# Patient Record
Sex: Male | Born: 1970 | Race: White | Hispanic: No | Marital: Married | Smoking: Former smoker
Health system: Southern US, Community
[De-identification: ages and names within clinical notes are randomized; demographics above are authoritative.]

## PROBLEM LIST (undated history)

## (undated) DIAGNOSIS — K219 Gastro-esophageal reflux disease without esophagitis: Secondary | ICD-10-CM

## (undated) DIAGNOSIS — F3181 Bipolar II disorder: Secondary | ICD-10-CM

## (undated) DIAGNOSIS — B019 Varicella without complication: Secondary | ICD-10-CM

## (undated) DIAGNOSIS — K5190 Ulcerative colitis, unspecified, without complications: Secondary | ICD-10-CM

## (undated) DIAGNOSIS — F419 Anxiety disorder, unspecified: Secondary | ICD-10-CM

## (undated) DIAGNOSIS — R0602 Shortness of breath: Secondary | ICD-10-CM

## (undated) DIAGNOSIS — J309 Allergic rhinitis, unspecified: Secondary | ICD-10-CM

## (undated) DIAGNOSIS — T7840XA Allergy, unspecified, initial encounter: Secondary | ICD-10-CM

## (undated) HISTORY — DX: Ulcerative colitis, unspecified, without complications: K51.90

## (undated) HISTORY — DX: Gastro-esophageal reflux disease without esophagitis: K21.9

## (undated) HISTORY — PX: COLONOSCOPY: SHX174

## (undated) HISTORY — DX: Bipolar II disorder: F31.81

## (undated) HISTORY — DX: Anxiety disorder, unspecified: F41.9

## (undated) HISTORY — PX: WISDOM TOOTH EXTRACTION: SHX21

## (undated) HISTORY — PX: TONSILECTOMY/ADENOIDECTOMY WITH MYRINGOTOMY: SHX6125

## (undated) HISTORY — DX: Allergic rhinitis, unspecified: J30.9

## (undated) HISTORY — DX: Allergy, unspecified, initial encounter: T78.40XA

## (undated) HISTORY — DX: Shortness of breath: R06.02

## (undated) HISTORY — DX: Varicella without complication: B01.9

---

## 2003-06-03 ENCOUNTER — Ambulatory Visit (HOSPITAL_COMMUNITY): Admission: RE | Admit: 2003-06-03 | Discharge: 2003-06-03 | Payer: Self-pay | Admitting: Gastroenterology

## 2013-10-29 ENCOUNTER — Encounter: Payer: Self-pay | Admitting: Gastroenterology

## 2013-11-19 ENCOUNTER — Ambulatory Visit: Payer: Self-pay | Admitting: Gastroenterology

## 2015-05-13 ENCOUNTER — Ambulatory Visit (INDEPENDENT_AMBULATORY_CARE_PROVIDER_SITE_OTHER): Payer: BLUE CROSS/BLUE SHIELD | Admitting: Gastroenterology

## 2015-05-13 ENCOUNTER — Other Ambulatory Visit (INDEPENDENT_AMBULATORY_CARE_PROVIDER_SITE_OTHER): Payer: BLUE CROSS/BLUE SHIELD

## 2015-05-13 ENCOUNTER — Encounter: Payer: Self-pay | Admitting: Gastroenterology

## 2015-05-13 VITALS — BP 104/76 | HR 68 | Ht 67.72 in | Wt 202.0 lb

## 2015-05-13 DIAGNOSIS — K5100 Ulcerative (chronic) pancolitis without complications: Secondary | ICD-10-CM

## 2015-05-13 LAB — COMPREHENSIVE METABOLIC PANEL
ALBUMIN: 3.9 g/dL (ref 3.5–5.2)
ALK PHOS: 86 U/L (ref 39–117)
ALT: 18 U/L (ref 0–53)
AST: 18 U/L (ref 0–37)
BILIRUBIN TOTAL: 0.3 mg/dL (ref 0.2–1.2)
BUN: 8 mg/dL (ref 6–23)
CALCIUM: 9.5 mg/dL (ref 8.4–10.5)
CO2: 29 mEq/L (ref 19–32)
Chloride: 103 mEq/L (ref 96–112)
Creatinine, Ser: 0.91 mg/dL (ref 0.40–1.50)
GFR: 95.58 mL/min (ref >60.00)
Glucose, Bld: 92 mg/dL (ref 70–99)
Potassium: 4.3 mEq/L (ref 3.5–5.1)
Sodium: 140 mEq/L (ref 135–145)
TOTAL PROTEIN: 7.5 g/dL (ref 6.0–8.3)

## 2015-05-13 LAB — CBC WITH DIFFERENTIAL/PLATELET
BASOS ABS: 0.1 10*3/uL (ref 0.0–0.1)
Basophils Relative: 0.8 % (ref 0.0–3.0)
Eosinophils Absolute: 0.6 10*3/uL (ref 0.0–0.7)
Eosinophils Relative: 6.1 % — ABNORMAL HIGH (ref 0.0–5.0)
HCT: 41 % (ref 39.0–52.0)
Hemoglobin: 13.6 g/dL (ref 13.0–17.0)
LYMPHS ABS: 2 10*3/uL (ref 0.7–4.0)
Lymphocytes Relative: 20.4 % (ref 12.0–46.0)
MCHC: 33.2 g/dL (ref 30.0–36.0)
MCV: 89.3 fl (ref 78.0–100.0)
MONO ABS: 0.7 10*3/uL (ref 0.1–1.0)
MONOS PCT: 6.7 % (ref 3.0–12.0)
NEUTROS ABS: 6.4 10*3/uL (ref 1.4–7.7)
Neutrophils Relative %: 66 % (ref 43.0–77.0)
PLATELETS: 442 10*3/uL — AB (ref 150.0–400.0)
RBC: 4.6 Mil/uL (ref 4.22–5.81)
RDW: 13.5 % (ref 11.5–15.5)
WBC: 9.8 10*3/uL (ref 4.0–10.5)

## 2015-05-13 LAB — SEDIMENTATION RATE: SED RATE: 55 mm/h — AB (ref 0–22)

## 2015-05-13 MED ORDER — NA SULFATE-K SULFATE-MG SULF 17.5-3.13-1.6 GM/177ML PO SOLN: 1.0000 | Freq: Once | ORAL | Status: DC

## 2015-05-13 NOTE — Patient Instructions (Addendum)
We will get records sent from your previous gastroenterologist (Dr. Watt Climes at [facility]) for review.  This will include any endoscopic (colonoscopy or upper endoscopy) procedures and any associated pathology reports.   You will have labs checked today in the basement lab.  Please head down after you check out with the front desk  (cbc, cmet, esr). You will be set up for a colonoscopy. Crohn's and [place].

## 2015-05-13 NOTE — Progress Notes (Signed)
HPI: This is a very pleasant 44 year old man  who was self-referred  Chief complaint is ulcerative colitis  He was having diarrhea, sometimes bloody, urgency.  Saw Dr. Watt Climes, underwent testing (colonoscopies times 2).  He was told he had ulcerative colitis, sounds like just distal colitis per patient. He took sulfasalazine orally.  Diet was restricted.  May have briefly been on steroids.  He used suppositories 1-2 times per week. He says the treatment helped only somewhat.   Frequency really seemed to improve.  He was not happy about his dietary restrictions.    No colitis meds for at least 6-8 years.  Lately he says he has a flare, diarrhea; bleeding, urgency, cramping. This flare had been for 6-8 weeks.    He had a flare about a year ago that was worse, lasted for 8 weeks.  No specific treatments.  No rashes on skin, no eye issues or joint problems.   Overall stable.  No NSAIDs.  Review of systems: Pertinent positive and negative review of systems were noted in the above HPI section. Complete review of systems was performed and was otherwise normal.   Past Medical History  Diagnosis Date  . Ulcerative colitis [facility])     Past Surgical History  Procedure Laterality Date  . Resection tonsil radical    . Colonoscopy      x2    No current outpatient prescriptions on file.   No current facility-administered medications for this visit.    Allergies as of 05/13/2015  . (No Known Allergies)    History reviewed. No pertinent family history.  Social History   Social History  . Marital Status: Single    Spouse Name: N/A  . Number of Children: 0  . Years of Education: N/A   Occupational History  . umemployed     Social History Main Topics  . Smoking status: Former Research scientist (life sciences)  . Smokeless tobacco: Never Used  . Alcohol Use: 0.0 oz/week    0 Standard drinks or equivalent per week     Comment:  2 drinks a day  . Drug Use: Not on file  . Sexual Activity: Not on file    Other Topics Concern  . Not on file   Social History Narrative  . No narrative on file     Physical Exam: BP 104/76 mmHg  Pulse 68  Ht 5' 7.72" (1.72 m)  Wt 202 lb (91.627 kg)  BMI 30.97 kg/m2 Constitutional: generally well-appearing Psychiatric: alert and oriented x3 Eyes: extraocular movements intact Mouth: oral pharynx moist, no lesions Neck: supple no lymphadenopathy Cardiovascular: heart regular rate and rhythm Lungs: clear to auscultation bilaterally Abdomen: soft, nontender, nondistended, no obvious ascites, no peritoneal signs, normal bowel sounds Extremities: no lower extremity edema bilaterally Skin: no lesions on visible extremities   Assessment and plan: 44 y.o. male with  history of ulcerative colitis now with loose stools, urgency, intermittent bleeding and cramping  I would like to get records from his previous gastroenterologist for review. He has been off of any specific colitis medicines for 68 years. His last colonoscopy was about 8 years ago I think it is very important that we restage his disease with a full colonoscopy. He will also get CBC, C met, sedimentation rate at his soonest convenience as well.   Owens Loffler, MD [facility] 05/13/2015, 2:23 PM  Cc:

## 2015-05-20 ENCOUNTER — Encounter (HOSPITAL_COMMUNITY): Payer: Self-pay | Admitting: *Deleted

## 2015-05-29 ENCOUNTER — Ambulatory Visit (HOSPITAL_COMMUNITY)
Admission: RE | Admit: 2015-05-29 | Discharge: 2015-05-29 | Disposition: A | Discharge status: Home / Self Care | Payer: BLUE CROSS/BLUE SHIELD | Source: Ambulatory Visit | Attending: Gastroenterology | Admitting: Gastroenterology

## 2015-05-29 ENCOUNTER — Ambulatory Visit (HOSPITAL_COMMUNITY): Payer: BLUE CROSS/BLUE SHIELD | Admitting: Anesthesiology

## 2015-05-29 ENCOUNTER — Encounter (HOSPITAL_COMMUNITY): Payer: Self-pay | Admitting: Anesthesiology

## 2015-05-29 ENCOUNTER — Telehealth: Payer: Self-pay

## 2015-05-29 ENCOUNTER — Telehealth: Payer: Self-pay | Admitting: Gastroenterology

## 2015-05-29 ENCOUNTER — Encounter (HOSPITAL_COMMUNITY)
Admission: RE | Disposition: A | Discharge status: Home / Self Care | Payer: Self-pay | Source: Ambulatory Visit | Attending: Gastroenterology

## 2015-05-29 DIAGNOSIS — K5100 Ulcerative (chronic) pancolitis without complications: Secondary | ICD-10-CM | POA: Diagnosis not present

## 2015-05-29 DIAGNOSIS — K5150 Left sided colitis without complications: Secondary | ICD-10-CM | POA: Diagnosis not present

## 2015-05-29 DIAGNOSIS — K5190 Ulcerative colitis, unspecified, without complications: Secondary | ICD-10-CM | POA: Diagnosis present

## 2015-05-29 DIAGNOSIS — Z87891 Personal history of nicotine dependence: Secondary | ICD-10-CM | POA: Diagnosis not present

## 2015-05-29 HISTORY — PX: COLONOSCOPY WITH PROPOFOL: SHX5780

## 2015-05-29 SURGERY — COLONOSCOPY WITH PROPOFOL
Anesthesia: Monitor Anesthesia Care

## 2015-05-29 MED ORDER — PROPOFOL 10 MG/ML IV BOLUS
INTRAVENOUS | Status: DC | PRN
  Administered 2015-05-29 (×2): 50 mg via INTRAVENOUS

## 2015-05-29 MED ORDER — LACTATED RINGERS IV SOLN
INTRAVENOUS | Status: DC | PRN
  Administered 2015-05-29: 11:00:00 via INTRAVENOUS

## 2015-05-29 MED ORDER — PROPOFOL 10 MG/ML IV BOLUS
INTRAVENOUS | Status: AC
  Filled 2015-05-29: qty 40

## 2015-05-29 MED ORDER — PROPOFOL 500 MG/50ML IV EMUL
INTRAVENOUS | Status: DC | PRN
  Administered 2015-05-29: 140 ug/kg/min via INTRAVENOUS

## 2015-05-29 MED ORDER — MESALAMINE 1.2 G PO TBEC: 4.8000 g | DELAYED_RELEASE_TABLET | Freq: Every day | ORAL | Status: DC

## 2015-05-29 MED ORDER — PREDNISONE 10 MG PO TABS: 40.0000 mg | ORAL_TABLET | Freq: Every day | ORAL | Status: DC

## 2015-05-29 MED ORDER — LIDOCAINE HCL (CARDIAC) 20 MG/ML IV SOLN
INTRAVENOUS | Status: DC | PRN
  Administered 2015-05-29: 50 mg via INTRAVENOUS

## 2015-05-29 MED ORDER — SODIUM CHLORIDE 0.9 % IV SOLN: INTRAVENOUS | Status: DC

## 2015-05-29 SURGICAL SUPPLY — 21 items

## 2015-05-29 NOTE — Telephone Encounter (Signed)
-----   Message from [name], MD sent at 05/29/2015 11:45 AM EST ----- He needs rov with me or extender in 4-6 weeks, UC follow up.  Thanks

## 2015-05-29 NOTE — H&P (View-Only) (Signed)
HPI: This is a very pleasant 44 year old man  who was self-referred  Chief complaint is ulcerative colitis  He was having diarrhea, sometimes bloody, urgency.  Saw Dr. Watt Climes, underwent testing (colonoscopies times 2).  He was told he had ulcerative colitis, sounds like just distal colitis per patient. He took sulfasalazine orally.  Diet was restricted.  May have briefly been on steroids.  He used suppositories 1-2 times per week. He says the treatment helped only somewhat.   Frequency really seemed to improve.  He was not happy about his dietary restrictions.    No colitis meds for at least 6-8 years.  Lately he says he has a flare, diarrhea; bleeding, urgency, cramping. This flare had been for 6-8 weeks.    He had a flare about a year ago that was worse, lasted for 8 weeks.  No specific treatments.  No rashes on skin, no eye issues or joint problems.   Overall stable.  No NSAIDs.  Review of systems: Pertinent positive and negative review of systems were noted in the above HPI section. Complete review of systems was performed and was otherwise normal.   Past Medical History  Diagnosis Date  . Ulcerative colitis [facility])     Past Surgical History  Procedure Laterality Date  . Resection tonsil radical    . Colonoscopy      x2    No current outpatient prescriptions on file.   No current facility-administered medications for this visit.    Allergies as of 05/13/2015  . (No Known Allergies)    History reviewed. No pertinent family history.  Social History   Social History  . Marital Status: Single    Spouse Name: N/A  . Number of Children: 0  . Years of Education: N/A   Occupational History  . umemployed     Social History Main Topics  . Smoking status: Former Research scientist (life sciences)  . Smokeless tobacco: Never Used  . Alcohol Use: 0.0 oz/week    0 Standard drinks or equivalent per week     Comment:  2 drinks a day  . Drug Use: Not on file  . Sexual Activity: Not on file    Other Topics Concern  . Not on file   Social History Narrative  . No narrative on file     Physical Exam: BP 104/76 mmHg  Pulse 68  Ht 5' 7.72" (1.72 m)  Wt 202 lb (91.627 kg)  BMI 30.97 kg/m2 Constitutional: generally well-appearing Psychiatric: alert and oriented x3 Eyes: extraocular movements intact Mouth: oral pharynx moist, no lesions Neck: supple no lymphadenopathy Cardiovascular: heart regular rate and rhythm Lungs: clear to auscultation bilaterally Abdomen: soft, nontender, nondistended, no obvious ascites, no peritoneal signs, normal bowel sounds Extremities: no lower extremity edema bilaterally Skin: no lesions on visible extremities   Assessment and plan: 44 y.o. male with  history of ulcerative colitis now with loose stools, urgency, intermittent bleeding and cramping  I would like to get records from his previous gastroenterologist for review. He has been off of any specific colitis medicines for 68 years. His last colonoscopy was about 8 years ago I think it is very important that we restage his disease with a full colonoscopy. He will also get CBC, C met, sedimentation rate at his soonest convenience as well.   Owens Loffler, MD [facility] 05/13/2015, 2:23 PM  Cc:

## 2015-05-29 NOTE — Telephone Encounter (Signed)
Status: Signed       Expand All Collapse All   ----- Message from [name], MD sent at 05/29/2015 11:45 AM EST ----- He needs rov with me or extender in 4-6 weeks, UC follow up. Thanks

## 2015-05-29 NOTE — Anesthesia Preprocedure Evaluation (Signed)
Anesthesia Evaluation  Patient identified by MRN, date of birth, ID band Patient awake    Reviewed: Allergy & Precautions, H&P , NPO status , Patient's Chart, lab work & pertinent test results  Airway Mallampati: II  TM Distance: >3 FB Neck ROM: full    Dental no notable dental hx. (+) Dental Advisory Given, Teeth Intact   Pulmonary neg pulmonary ROS, former smoker,    Pulmonary exam normal breath sounds clear to auscultation       Cardiovascular Exercise Tolerance: Good negative cardio ROS Normal cardiovascular exam Rhythm:regular Rate:Normal     Neuro/Psych negative neurological ROS  negative psych ROS   GI/Hepatic negative GI ROS, Neg liver ROS,   Endo/Other  negative endocrine ROS  Renal/GU negative Renal ROS  negative genitourinary   Musculoskeletal   Abdominal   Peds  Hematology negative hematology ROS (+)   Anesthesia Other Findings   Reproductive/Obstetrics negative OB ROS                             Anesthesia Physical Anesthesia Plan  ASA: II  Anesthesia Plan: MAC   Post-op Pain Management:    Induction:   Airway Management Planned:   Additional Equipment:   Intra-op Plan:   Post-operative Plan:   Informed Consent: I have reviewed the patients History and Physical, chart, labs and discussed the procedure including the risks, benefits and alternatives for the proposed anesthesia with the patient or authorized representative who has indicated his/her understanding and acceptance.   Dental Advisory Given  Plan Discussed with: CRNA and Surgeon  Anesthesia Plan Comments:         Anesthesia Quick Evaluation

## 2015-05-29 NOTE — Transfer of Care (Signed)
Immediate Anesthesia Transfer of Care Note  Patient: Brian Bates  Procedure(s) Performed: Procedure(s): COLONOSCOPY WITH PROPOFOL (N/A)  Patient Location: PACU  Anesthesia Type:MAC  Level of Consciousness: awake, alert  and oriented  Airway & Oxygen Therapy: Patient Spontanous Breathing and Patient connected to face mask oxygen  Post-op Assessment: Report given to RN and Post -op Vital signs reviewed and stable  Post vital signs: Reviewed and stable  Last Vitals:  Filed Vitals:   05/29/15 1107  BP: 134/68  Pulse: 86  Temp: 36.8 C  Resp: 16    Complications: No apparent anesthesia complications

## 2015-05-29 NOTE — Anesthesia Postprocedure Evaluation (Signed)
Anesthesia Post Note  Patient: Brian Bates  Procedure(s) Performed: Procedure(s) (LRB): COLONOSCOPY WITH PROPOFOL (N/A)  Patient location during evaluation: PACU Anesthesia Type: MAC Level of consciousness: awake and alert Pain management: pain level controlled Vital Signs Assessment: post-procedure vital signs reviewed and stable Respiratory status: spontaneous breathing, nonlabored ventilation, respiratory function stable and patient connected to nasal cannula oxygen Cardiovascular status: blood pressure returned to baseline and stable Postop Assessment: no signs of nausea or vomiting Anesthetic complications: no    Last Vitals:  Filed Vitals:   05/29/15 1107 05/29/15 1139  BP: 134/68 101/57  Pulse: 86 73  Temp: 36.8 C 37 C  Resp: 16 16    Last Pain: There were no vitals filed for this visit.               [name] L

## 2015-05-29 NOTE — Telephone Encounter (Signed)
See alternate note  

## 2015-05-29 NOTE — Op Note (Signed)
[facility] [place], [address]   COLONOSCOPY PROCEDURE REPORT  PATIENT: Brian Bates, Brian Bates  MR#: [id number] BIRTHDATE: July 17, 1970 , 40  yrs. old GENDER: male ENDOSCOPIST: [name], MD PROCEDURE DATE:  05/29/2015 PROCEDURE:   Colonoscopy, diagnostic and Colonoscopy with biopsy First Screening Colonoscopy - Avg.  risk and is 50 yrs.  old or older - No.  Prior Negative Screening - Now for repeat screening. N/A  History of Adenoma - Now for follow-up colonoscopy & has been > or = to 3 yrs.  N/A  Recommend repeat exam, <10 yrs? No ASA CLASS:   Class II INDICATIONS:history of UC, previously with Dr.  Watt Climes, not on UC meds for 6-8 years; now with diarrhea, cramping. MEDICATIONS: Monitored anesthesia care  DESCRIPTION OF PROCEDURE:   After the risks benefits and alternatives of the procedure were thoroughly explained, informed consent was obtained.  The digital rectal exam revealed no abnormalities of the rectum.   The [id number]  endoscope was introduced through the anus and advanced to the terminal ileum which was intubated for a short distance. No adverse events experienced.   The quality of the prep was excellent.  The instrument was then slowly withdrawn as the colon was fully examined. Estimated blood loss is zero unless otherwise noted in this procedure report.   COLON FINDINGS: The examined terminal ileum appeared to be normal. The colon was severely inflamed from anus to about 65cm into the colon with a fairly distinct transition to normal appearing mucosa. The right colon mucosa was normal, was biopsied extensively.  The overt colitis in left colon was also biopsies and sent to pathology.  Retroflexion was not performed due to a narrow rectal vault. The time to cecum = 3.8 Withdrawal time = 12.8   The scope was withdrawn and the procedure completed. COMPLICATIONS: There were no immediate complications.  ENDOSCOPIC  IMPRESSION: 1.   The examined terminal ileum appeared to be normal 2.   The colon was severely inflamed from anus to about 65cm into the colon with a fairly distinct transition to normal appearing mucosa.  The right colon mucosa was normal, was biopsied extensively.  The overt colitis in left colon was also biopsies and sent to pathology  RECOMMENDATIONS: Will start on prednisone 40mg  once daily.  Do not taper this until you are seen in the office (will set up appt for 4-5 weeks from now).  Please also start lialda (4 pills once daily).  eSigned:  [name], MD 05/29/2015 11:43 AM

## 2015-05-29 NOTE — Discharge Instructions (Signed)

## 2015-05-29 NOTE — Interval H&P Note (Signed)
History and Physical Interval Note:  05/29/2015 11:01 AM  Brian Bates  has presented today for surgery, with the diagnosis of UC  The various methods of treatment have been discussed with the patient and family. After consideration of risks, benefits and other options for treatment, the patient has consented to  Procedure(s): COLONOSCOPY WITH PROPOFOL (N/A) as a surgical intervention .  The patient's history has been reviewed, patient examined, no change in status, stable for surgery.  I have reviewed the patient's chart and labs.  Questions were answered to the patient's satisfaction.     [name]

## 2015-05-29 NOTE — Telephone Encounter (Signed)
08/12/15 at 2:30 pm appt with Dr Ardis Hughs pt also has concerns about the cost of Lialda and would like something cheaper, I have a Lialda prescription savings card at the front desk for the pt.  Message left for pt to return call for this information.

## 2015-05-30 ENCOUNTER — Encounter (HOSPITAL_COMMUNITY): Payer: Self-pay | Admitting: Gastroenterology

## 2015-06-03 NOTE — Telephone Encounter (Signed)
   Please call the patient. Biopsies were consistent with ulcerative colitis. Should continue with the suggestions outlined at recent visit. Can you make sure he has rov in 4-5 weeks (myself or extender), thanks

## 2015-06-04 NOTE — Telephone Encounter (Signed)
Pt notified of appt date and time.

## 2015-07-01 ENCOUNTER — Telehealth: Payer: Self-pay | Admitting: Gastroenterology

## 2015-07-01 NOTE — Telephone Encounter (Signed)
  Colonoscopy December 2004, Dr. Elta Guadeloupe May God from [facility], done for bloody diarrhea, findings left-sided colitis to 30 cm, otherwise normal examination to the terminal ileum. Biopsies of the terminal ileum were normal, descending colon and left colon biopsies showed focal active colitis and active chronic colitis consistent with IBD. In the comment section pathologist wrote that "the extension of the inflammation to deeper sections of the biopsies raises the possibility of Crohn's disease however no granulomas or dysplasia were seen, overall more in favor of ulcerative colitis.

## 2015-07-24 ENCOUNTER — Other Ambulatory Visit: Payer: Self-pay

## 2015-07-24 ENCOUNTER — Encounter: Payer: Self-pay | Admitting: Gastroenterology

## 2015-07-24 MED ORDER — MESALAMINE 1.2 G PO TBEC: 4.8000 g | DELAYED_RELEASE_TABLET | Freq: Every day | ORAL | Status: DC

## 2015-07-29 ENCOUNTER — Telehealth: Payer: Self-pay | Admitting: Gastroenterology

## 2015-07-30 ENCOUNTER — Telehealth: Payer: Self-pay | Admitting: Gastroenterology

## 2015-07-30 NOTE — Telephone Encounter (Signed)
Pt has been depressed, angry, and scared and this began after starting prednisone I offered appt to the pt with Cecille Rubin to discuss tapering and the pt declined.  He has an appt on 08/12/15 with Dr Ardis Hughs.  I did advise the pt not to stop the prednisone abruptly due to the withdrawal symptoms of the steroid.  The pt states he would like me to speak to his husband.  His husband got on the phone and said that the pt is very moody normally but is much worse now.  He states that the pt is threatening to move out of the house.  I advised the husband that if the pt declines the offered appts that we cant force him to come in. I advised that if the pt's mental status becomes violent or he threatens to harm him self he will need to have ED eval. Or call the police.  The pt's husband is requesting anti anxiety med.  I advised we could not prescribe any medications without evaluation.  The husband stated well if he wont calm down and stay home I am going to get a drink and hung up the phone.

## 2015-07-30 NOTE — Telephone Encounter (Signed)
See alternate note  

## 2015-07-31 NOTE — Telephone Encounter (Signed)
Ok, thanks.

## 2015-08-12 ENCOUNTER — Encounter: Payer: Self-pay | Admitting: Gastroenterology

## 2015-08-12 ENCOUNTER — Ambulatory Visit (INDEPENDENT_AMBULATORY_CARE_PROVIDER_SITE_OTHER): Payer: BLUE CROSS/BLUE SHIELD | Admitting: Gastroenterology

## 2015-08-12 VITALS — BP 120/60 | HR 80 | Ht 68.0 in | Wt 208.0 lb

## 2015-08-12 DIAGNOSIS — K5100 Ulcerative (chronic) pancolitis without complications: Secondary | ICD-10-CM | POA: Diagnosis not present

## 2015-08-12 MED ORDER — MESALAMINE ER 0.375 G PO CP24: 1500.0000 mg | ORAL_CAPSULE | Freq: Every day | ORAL | Status: DC

## 2015-08-12 NOTE — Patient Instructions (Signed)
Since lialda ws too expensive, we'll try apriso 4 pills once daily. Call ASAP if this is also cost-prohibitive. Please return to see Dr. Ardis Hughs in 2 months.

## 2015-08-12 NOTE — Progress Notes (Signed)
Review of pertinent gastrointestinal problems: 1. Ulcerative colitis; Colonoscopy December 2004, Dr. Lizbeth Bark from [facility], done for bloody diarrhea, findings left-sided colitis to 30 cm, otherwise normal examination to the terminal ileum. Biopsies of the terminal ileum were normal, descending colon and left colon biopsies showed focal active colitis and active chronic colitis consistent with IBD. In the comment section pathologist wrote that "the extension of the inflammation to deeper sections of the biopsies raises the possibility of Crohn's disease however no granulomas or dysplasia were seen, overall more in favor of ulcerative colitis. Established care with Dr. Ardis Hughs 2016, had not been on UC meds for 6-8 years. ESR was elevated.  Colonsocopy 05/2015 showed severe inflammation to 65cm with distinct transition to normal.  Right colon normal (path was normal), TI was normal, left colon path +chronic active colitis. He was started on prednisone and lialda however never started the lialda.  HPI: This is a  pleasant 45 year old man whom I last saw about 2 and half months ago.   Chief complaint is ulcerative colitis  Bleeding has completely stopped.  Diarrhea continues, but not nearly like it was.  Still on prednisone 8m daily.  I also prescribed him lialda however it was cost prohibitive. We were able to obtain a voucher for it but he never got around to turning in the voucher and so he never started that medicine.   Past Medical History  Diagnosis Date  . Ulcerative colitis ([facility]     Past Surgical History  Procedure Laterality Date  . Resection tonsil radical    . Colonoscopy      x2  . Colonoscopy with propofol N/A 05/29/2015    Procedure: COLONOSCOPY WITH PROPOFOL;  Surgeon: DMilus Banister MD;  Location: [facility] ENDOSCOPY;  Service: Endoscopy;  Laterality: N/A;    Current Outpatient Prescriptions  Medication Sig Dispense Refill  . predniSONE (DELTASONE) 10 MG tablet Take 4 tablets  (40 mg total) by mouth daily with breakfast. 120 tablet 2   No current facility-administered medications for this visit.    Allergies as of 08/12/2015  . (No Known Allergies)    Family History  Problem Relation Age of Onset  . Colon cancer Neg Hx   . Esophagitis Mother   . Sjogren's syndrome Mother     Social History   Social History  . Marital Status: Married    Spouse Name: N/A  . Number of Children: 0  . Years of Education: N/A   Occupational History  . umemployed     Social History Main Topics  . Smoking status: Former Smoker -- 1 years    Types: Cigarettes  . Smokeless tobacco: Never Used  . Alcohol Use: 0.0 oz/week    0 Standard drinks or equivalent per week     Comment:  2 drinks a day  . Drug Use: No  . Sexual Activity: Not on file   Other Topics Concern  . Not on file   Social History Narrative     Physical Exam: BP 120/60 mmHg  Pulse 80  Ht 5' 8"  (1.727 m)  Wt 208 lb (94.348 kg)  BMI 31.63 kg/m2 Constitutional: generally well-appearing Psychiatric: alert and oriented x3 Abdomen: soft, nontender, nondistended, no obvious ascites, no peritoneal signs, normal bowel sounds   Assessment and plan: 45y.o. male with ulcerative colitis  I explained to him that he really needs to be on a medicine longer-term rather than just prednisone. Mesalamine is a mainstay for this. Since lialda was cost  prohibitive and he never got around to filling out the [place] which we obtained for him, we will try apris which is a mesalamine formulation similar, full strength. 4 pills once daily. He will continue the prednisone for now but if we can establish him on a good full strength mesalamine product I will instruct him on how to start tapering his steroids. He'll return to see me in 2 months and sooner if needed.o    Owens Loffler, MD [facility] 08/12/2015, 3:42 PM

## 2015-08-21 ENCOUNTER — Telehealth: Payer: Self-pay | Admitting: Gastroenterology

## 2015-08-22 ENCOUNTER — Telehealth: Payer: Self-pay

## 2015-08-22 NOTE — Telephone Encounter (Signed)
-----   Message from Wyline Beady, [place] sent at 08/21/2015  3:22 PM EST ----- This patient came by the office a little irate thinking that his Rx for Apriso was never sent in. I assured him that it was and called the pharmacy to verify. They stated that they are waiting on a prior auth to come back. Patient states he needs something in the meantime and cannot go another week without being treated. I assured him that we are working on it and will get him something ASAP. Please advise!

## 2015-08-22 NOTE — Telephone Encounter (Signed)
-----   Message from [name], MD sent at 08/22/2015  9:23 AM EST ----- He is being treated. He is on prednisone 40mg  daily, or at least he was as of his last office visit a couple weeks ago. Can you confirm that he is still taking that steroid?  dj  ----- Message -----    From: Wyline Beady, CMA    Sent: 08/21/2015   3:22 PM      To: [name], MD, Barron Alvine, RN  This patient came by the office a little irate thinking that his Rx for Apriso was never sent in. I assured him that it was and called the pharmacy to verify. They stated that they are waiting on a prior auth to come back. Patient states he needs something in the meantime and cannot go another week without being treated. I assured him that we are working on it and will get him something ASAP. Please advise!

## 2015-08-25 NOTE — Telephone Encounter (Signed)
Left message on machine to call back  

## 2015-08-26 ENCOUNTER — Telehealth: Payer: Self-pay | Admitting: Gastroenterology

## 2015-08-26 ENCOUNTER — Encounter: Payer: Self-pay | Admitting: Gastroenterology

## 2015-08-26 MED ORDER — MESALAMINE 800 MG PO TBEC: 1600.0000 mg | DELAYED_RELEASE_TABLET | Freq: Three times a day (TID) | ORAL | Status: DC

## 2015-08-26 NOTE — Telephone Encounter (Signed)
See alternate notes

## 2015-08-26 NOTE — Telephone Encounter (Signed)
Ok, lets try asacol HD 800mg  pill.  Take 2 pills three times daily.  Disp one month with 11 refills.  Thanks

## 2015-08-26 NOTE — Telephone Encounter (Signed)
Pt has been notified that the request was sent to the insurance for prior approval and it has been upgraded to urgent.  He will call if no response by tomorrow

## 2015-08-26 NOTE — Telephone Encounter (Signed)
Prior Brian Bates has been sent to the insurance, pt aware

## 2015-08-26 NOTE — Telephone Encounter (Signed)
Pt aware that Dorthy Cooler was denied by [employer] and asacol has been sent to the pharmacy.  He will call back if the cost is to much or the med is not effective.

## 2015-08-26 NOTE — Telephone Encounter (Signed)
Returning call regarding having trouble filling his script.

## 2015-08-26 NOTE — Telephone Encounter (Signed)
Dr Lorraine Lax is not covered under the pt insurance, he states that he can not take prednisone, it gives him "side effects".  Please advise

## 2015-08-28 NOTE — Telephone Encounter (Signed)
Patient husband, Brian Bates states that insurance has not received this prior authorization form. He is requesting that we mark this as "urgent". Patient husband is requesting a call back when this has been sent in. [phone]

## 2015-08-29 ENCOUNTER — Telehealth: Payer: Self-pay | Admitting: Gastroenterology

## 2015-08-29 ENCOUNTER — Other Ambulatory Visit: Payer: Self-pay

## 2015-08-29 MED ORDER — MESALAMINE 400 MG PO CPDR: 1600.0000 mg | DELAYED_RELEASE_CAPSULE | Freq: Four times a day (QID) | ORAL | Status: DC

## 2015-08-29 MED ORDER — MESALAMINE 400 MG PO CPDR: 1600.0000 mg | DELAYED_RELEASE_CAPSULE | Freq: Three times a day (TID) | ORAL | Status: DC

## 2015-08-29 NOTE — Telephone Encounter (Signed)
Prior auth form faxed to [facility] and urgent written in the bottom corner of form as instructed by [employer].

## 2015-08-29 NOTE — Telephone Encounter (Signed)
patient states that he is still having problems with prior authorization on asacol. he is requesting a call back. Please see previous notes regarding this. patient is requesting a rx for a medication that would be less expensive. Patient is getting upset over this and is requesting callback today. He is requesting some kind of script today.

## 2015-08-29 NOTE — Telephone Encounter (Signed)
Left message for pt that Dr. Ardis Hughs is not here today and prior auth has been denied for apriso, and asacol. Waiting to hear back from Dr. Ardis Hughs if pt can be started on Delzicol.

## 2015-08-29 NOTE — Telephone Encounter (Signed)
Script sent to pharmacy.

## 2015-08-29 NOTE — Telephone Encounter (Signed)
Patient returned phone call. Best # [phone]

## 2015-08-29 NOTE — Telephone Encounter (Signed)
Pts husband called and voices concern that they have been waiting for 3 weeks trying to get the medications approved that he needs. Prior Josem Kaufmann was denied for apriso and asacol. Pts insurance will cover delzicol. Asking for a prescription of delzicol. Pt was prescribed asacol 1600mg  tid. Dr. Loletha Carrow as doc of the day please advise regarding delzicol script for this Ardis Hughs pt.

## 2015-08-29 NOTE — Telephone Encounter (Signed)
No prior auth needed for delzicol. Pt got card for assistance from pharmacy and was able to get the 1200.00 script for 30 for this month.

## 2015-08-29 NOTE — Telephone Encounter (Signed)
Sounds like it must be Delzicol for him.  Rx:  Delzicol 400 mg caplet. Sig: 4 caps by mouth three times daily with meals. Disp: #360 1 refill  Dr Ardis Hughs can then manage as he sees fit

## 2015-10-05 ENCOUNTER — Other Ambulatory Visit: Payer: Self-pay | Admitting: Gastroenterology

## 2015-10-06 ENCOUNTER — Other Ambulatory Visit: Payer: Self-pay | Admitting: Gastroenterology

## 2015-10-06 NOTE — Telephone Encounter (Signed)
Rx refills needed for Delzicol 400mg  4 tabs TID.

## 2015-11-15 ENCOUNTER — Other Ambulatory Visit: Payer: Self-pay | Admitting: Gastroenterology

## 2016-01-29 ENCOUNTER — Telehealth: Payer: Self-pay | Admitting: Gastroenterology

## 2016-01-29 MED ORDER — PREDNISONE 10 MG PO TABS: 10.0000 mg | ORAL_TABLET | Freq: Two times a day (BID) | ORAL | 1 refills | Status: DC

## 2016-01-29 NOTE — Telephone Encounter (Signed)
Left message on machine to call back  

## 2016-01-29 NOTE — Telephone Encounter (Signed)
The pt is aware and will call back to set up appt, he was advised he could not have any further refills until seen in the office.

## 2016-01-29 NOTE — Telephone Encounter (Signed)
The pt is having a UC flare with generalized abd pain, rectal bleeding, diarrhea off/on, nausea.  He says this is the same symptoms he has had in the past.  He is taking delzicol 2 capsules 4 times daily.  He had several prednisone from prior flare and took those with no relief.  He would like to have a refill on prednisone.  Please advise.

## 2016-01-29 NOTE — Telephone Encounter (Signed)
Ok, he needs to stay on the delzichol.  Please prescribe prednisone 10mg  pills, take one pill twice daily, disp 60 with two refills, he also needs next available rov with me (do not double book).  I last saw him 6 months ago and he was supposed to be back in 2 months.  Thanks

## 2016-03-20 ENCOUNTER — Other Ambulatory Visit: Payer: Self-pay | Admitting: Gastroenterology

## 2016-05-23 ENCOUNTER — Other Ambulatory Visit: Payer: Self-pay | Admitting: Gastroenterology

## 2016-06-05 ENCOUNTER — Other Ambulatory Visit: Payer: Self-pay | Admitting: Gastroenterology

## 2016-08-10 ENCOUNTER — Ambulatory Visit (INDEPENDENT_AMBULATORY_CARE_PROVIDER_SITE_OTHER): Payer: BLUE CROSS/BLUE SHIELD | Admitting: Family Medicine

## 2016-08-10 ENCOUNTER — Encounter: Payer: Self-pay | Admitting: Family Medicine

## 2016-08-10 DIAGNOSIS — J309 Allergic rhinitis, unspecified: Secondary | ICD-10-CM | POA: Insufficient documentation

## 2016-08-10 DIAGNOSIS — K51011 Ulcerative (chronic) pancolitis with rectal bleeding: Secondary | ICD-10-CM

## 2016-08-10 DIAGNOSIS — K5190 Ulcerative colitis, unspecified, without complications: Secondary | ICD-10-CM | POA: Insufficient documentation

## 2016-08-10 DIAGNOSIS — K219 Gastro-esophageal reflux disease without esophagitis: Secondary | ICD-10-CM | POA: Insufficient documentation

## 2016-08-10 DIAGNOSIS — F3181 Bipolar II disorder: Secondary | ICD-10-CM | POA: Insufficient documentation

## 2016-08-10 NOTE — Progress Notes (Signed)
Phone: [phone]  Subjective:  Patient presents today to establish care.  Prior patient of - has not had a primary care doctor in a while. Chief complaint-noted.   See problem oriented charting  The following were reviewed and entered/updated in epic: Past Medical History:  Diagnosis Date  . Allergic rhinitis    otc antihistamine  . Bipolar 2 disorder [facility])    diagnosed by psychiatry Dr. Reece Levy- 6 years ago  . Chicken pox   . GERD (gastroesophageal reflux disease)    prn  . Ulcerative colitis [facility])    Patient Active Problem List   Diagnosis Date Noted  . Bipolar 2 disorder ([place])     Priority: High  . Ulcerative pancolitis with rectal bleeding ([facility])     Priority: High  . GERD (gastroesophageal reflux disease)     Priority: Low  . Allergic rhinitis     Priority: Low   Past Surgical History:  Procedure Laterality Date  . COLONOSCOPY     x2  . COLONOSCOPY WITH PROPOFOL N/A 05/29/2015   Procedure: COLONOSCOPY WITH PROPOFOL;  Surgeon: [name], MD;  Location: [facility];  Service: Endoscopy;  Laterality: N/A;  . TONSILECTOMY/ADENOIDECTOMY WITH MYRINGOTOMY      Family History  Problem Relation Age of Onset  . Esophagitis Mother   . Sjogren's syndrome Mother   . Lupus Mother   . Stroke Mother     in 11s  . Heart attack Mother     in 64s  . Bipolar disorder Mother   . Hypertension Mother   . Cataracts Father   . Other Sister     "bone marrow issue"  . Bipolar disorder Sister   . Asthma Brother   . Alcohol abuse Maternal Grandfather     several issues on this side  . Colon cancer Neg Hx     Medications- reviewed and updated Current Outpatient Prescriptions  Medication Sig Dispense Refill  . DELZICOL 400 MG CPDR DR capsule TAKE 4 CAPSULES (1,600 MG TOTAL) BY MOUTH 4 (FOUR) TIMES DAILY. 360 capsule 1  . predniSONE (DELTASONE) 10 MG tablet TAKE 1 TABLET BY MOUTH 2 TIMES DAILY WITH A MEAL. 60 tablet 1   No current facility-administered medications for  this visit.     Allergies-reviewed and updated No Known Allergies  Social History   Social History  . Marital status: Married    Spouse name: N/A  . Number of children: 0  . Years of education: N/A   Occupational History  . umemployed     Social History Main Topics  . Smoking status: Former Smoker    Packs/day: 1.00    Years: 1.00    Types: Cigarettes    Quit date: 06/21/1994  . Smokeless tobacco: Never Used  . Alcohol use 0.0 oz/week     Comment:  2 drinks a day prior- supportive of alcoholic husband so rarely now  . Drug use: No  . Sexual activity: Not Asked   Other Topics Concern  . None   Social History Narrative   Married to [facility] (patient of Dr. Yong Channel.       Unemployed at present- takes care of things at home. 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 Last employeed 2012- [employer].    Hard due to the ulcerative colitis and poor control for him to work          ROS--Full ROS was completed Review of Systems  Constitutional: Positive for malaise/fatigue and weight loss. Negative for chills and fever.  HENT: Negative for hearing loss and tinnitus.   Eyes: Negative for blurred vision and double vision.  Respiratory: Negative for cough and shortness of breath.   Cardiovascular: Negative for chest pain and palpitations.  Gastrointestinal: Positive for abdominal pain, blood in stool, diarrhea and nausea. Negative for heartburn.  Genitourinary: Negative for dysuria and urgency.  Musculoskeletal: Negative for myalgias and neck pain.  Skin: Negative for itching and rash.  Neurological: Positive for weakness. Negative for dizziness and headaches.  Endo/Heme/Allergies: Negative for polydipsia. Does not bruise/bleed easily.  Psychiatric/Behavioral: Positive for depression. Negative for substance abuse and suicidal ideas. The patient is nervous/anxious.        Agitation easily produced   Objective: BP 124/78 (BP Location: Left Arm, Patient Position: Sitting, Cuff Size: Large)    Pulse 92   Temp 98.1 F (36.7 C) (Oral)   Ht 5\' 8"  (1.727 m)   Wt 194 lb (88 kg)   SpO2 97%   BMI 29.50 kg/m  Gen: NAD, resting comfortably Pallor of mucus membranes noted HEENT: Mucous membranes are moist. Oropharynx normal. TM normal. Eyes: sclera and lids normal, PERRLA Neck: no thyromegaly, no cervical lymphadenopathy CV: RRR no murmurs rubs or gallops Lungs: CTAB no crackles, wheeze, rhonchi Abdomen: soft/nontender/nondistended/normal bowel sounds. No rebound or guarding.  Ext: no edema Skin: warm, dry Neuro: 5/5 strength in upper and lower extremities, normal gait, normal reflexes Psych: appears uninterested in visit at times- admits only hear because husband and mother very strongly influenced visit  Assessment/Plan:  Bipolar 2 disorder [facility]) S: diagnosed by psychiatry Dr. Reece Levy- 6 years ago. On no medications at present. Please note- no SI/HI.  A/P: he admits this untreated issue makes it very hard for him to interact with medical field and to be compliant. I strongly encouraged follow up with psychiatry- he would like to get in a better place from UC first. I am concerned that it will be hard to get him compliant with UC meds until this is addressed. I did give him a list of providers in area and he can call and check in.    Ulcerative pancolitis with rectal bleeding ([place]) S: Patient states he has had poorly controlled ulcerative colitis for months. He is using his mothers prednisone since he ran out. Never ran out of his delzicol because he is only taking 2-4 a day when dosing states 4 pills 4x a day but he states this causes too much diarrhea.   Over last 2 weeks, he states he has felt gassy and noted some blood at his anus and then 15 minutes later has bowel movement. He restarted prednisone 10mg  at 5 tablets down to 3 over a week period. He feels like that really helps. On the other hand- he states concerned about becoming hyperactive and more irritable on prednisone.    He states this has contributed to depressed mood because essentially wears a diaper with frequent BMs at times (reported up to 30 in past) though at present about 3x a day. Certain triggers like caffeine lead to automatic stooling. Has had fainting spells in past with drops in hgb but none of that recently A/P: I encouraged him with taking 4 delizicol last night to take 4 twice a day over next day and then slowly build up further. If his concern is frequent BM on regimen- he should follow up with GI to discuss options- this was the focus of visit today- repetitively stating the importance of calling GI and setting up follow up  tomorrow. We would have helped with this but did not start seeing patient until after 5 pm  Update labs Orders Placed This Encounter  Procedures  . CBC with Differential/Platelet  . Comprehensive metabolic panel    [place]  . Sedimentation rate    [place]   The duration of face-to-face time during this visit was greater than 30 minutes. Greater than 50% of this time was spent in counseling, explanation of diagnosis, planning of further management, and/or coordination of care including primarily focus on importance of mental health and mental health follow up as well as GI follow up and return precautions for worsening GI or psychiatric symptoms.    Return precautions advised. STRONGLY focused on importance of GI follow up  Garret Reddish, MD

## 2016-08-10 NOTE — Progress Notes (Signed)
Pre visit review using our clinic review tool, if applicable. No additional management support is needed unless otherwise documented below in the visit note. 

## 2016-08-10 NOTE — Patient Instructions (Signed)
Please stop by lab before you go  Please call Dr. Ardis Hughs office tomorrow- SO important for you to get plugged back in. Also important to try to push to get delzicol closer to prescribed dose. Try to get at least 4 pills in twice a day tomorrow and build up to prescribed dose.   When things are in better place from ulcerative colitis- need to get you plugged back into psychiatry

## 2016-08-10 NOTE — Assessment & Plan Note (Signed)
S: Patient states he has had poorly controlled ulcerative colitis for months. He is using his mothers prednisone since he ran out. Never ran out of his delzicol because he is only taking 2-4 a day when dosing states 4 pills 4x a day but he states this causes too much diarrhea.   Over last 2 weeks, he states he has felt gassy and noted some blood at his anus and then 15 minutes later has bowel movement. He restarted prednisone 10mg  at 5 tablets down to 3 over a week period. He feels like that really helps. On the other hand- he states concerned about becoming hyperactive and more irritable on prednisone.   He states this has contributed to depressed mood because essentially wears a diaper with frequent BMs at times (reported up to 30 in past) though at present about 3x a day. Certain triggers like caffeine lead to automatic stooling. Has had fainting spells in past with drops in hgb but none of that recently A/P: I encouraged him with taking 4 delizicol last night to take 4 twice a day over next day and then slowly build up further. If his concern is frequent BM on regimen- he should follow up with GI to discuss options- this was the focus of visit today- repetitively stating the importance of calling GI and setting up follow up tomorrow. We would have helped with this but did not start seeing patient until after 5 pm

## 2016-08-10 NOTE — Assessment & Plan Note (Addendum)
S: diagnosed by psychiatry Dr. Reece Levy- 6 years ago. On no medications at present. Please note- no SI/HI.  A/P: he admits this untreated issue makes it very hard for him to interact with medical field and to be compliant. I strongly encouraged follow up with psychiatry- he would like to get in a better place from UC first. I am concerned that it will be hard to get him compliant with UC meds until this is addressed. I did give him a list of providers in area and he can call and check in.

## 2016-08-11 LAB — CBC WITH DIFFERENTIAL/PLATELET
BASOS ABS: 0.1 10*3/uL (ref 0.0–0.1)
Basophils Relative: 0.7 % (ref 0.0–3.0)
EOS PCT: 1.9 % (ref 0.0–5.0)
Eosinophils Absolute: 0.3 10*3/uL (ref 0.0–0.7)
HCT: 41.4 % (ref 39.0–52.0)
HEMOGLOBIN: 13.6 g/dL (ref 13.0–17.0)
Lymphocytes Relative: 22.1 % (ref 12.0–46.0)
Lymphs Abs: 3.2 10*3/uL (ref 0.7–4.0)
MCHC: 32.7 g/dL (ref 30.0–36.0)
MCV: 84.3 fl (ref 78.0–100.0)
MONO ABS: 1 10*3/uL (ref 0.1–1.0)
MONOS PCT: 6.9 % (ref 3.0–12.0)
NEUTROS PCT: 68.4 % (ref 43.0–77.0)
Neutro Abs: 10 10*3/uL — ABNORMAL HIGH (ref 1.4–7.7)
Platelets: 561 10*3/uL — ABNORMAL HIGH (ref 150.0–400.0)
RBC: 4.91 Mil/uL (ref 4.22–5.81)
RDW: 16.7 % — ABNORMAL HIGH (ref 11.5–15.5)
WBC: 14.6 10*3/uL — AB (ref 4.0–10.5)

## 2016-08-11 LAB — COMPREHENSIVE METABOLIC PANEL
ALBUMIN: 4.2 g/dL (ref 3.5–5.2)
ALK PHOS: 80 U/L (ref 39–117)
ALT: 21 U/L (ref 0–53)
AST: 17 U/L (ref 0–37)
BILIRUBIN TOTAL: 0.5 mg/dL (ref 0.2–1.2)
BUN: 10 mg/dL (ref 6–23)
CO2: 30 mEq/L (ref 19–32)
Calcium: 10 mg/dL (ref 8.4–10.5)
Chloride: 102 mEq/L (ref 96–112)
Creatinine, Ser: 0.99 mg/dL (ref 0.40–1.50)
GFR: 86.25 mL/min (ref >60.00)
GLUCOSE: 103 mg/dL — AB (ref 70–99)
POTASSIUM: 4.4 meq/L (ref 3.5–5.1)
SODIUM: 141 meq/L (ref 135–145)
TOTAL PROTEIN: 7.2 g/dL (ref 6.0–8.3)

## 2016-08-11 LAB — SEDIMENTATION RATE: Sed Rate: 22 mm/hr — ABNORMAL HIGH (ref 0–15)

## 2016-08-19 NOTE — Progress Notes (Signed)
Called and spoke to patient who stated [place] is [place] of [place] right now for a [place]. He does state he hasn't noted any blood in his stool for the last  2 days. He does plan to call GI when he gets back. He stated he has not had to take prednisone. Encouraged him to call if he needs anything.

## 2016-09-30 ENCOUNTER — Telehealth: Payer: Self-pay

## 2016-09-30 NOTE — Telephone Encounter (Signed)
Called and spoke with patient who stated he has not seen Psychiatry yet. I confirmed that he still has the list that we provided him. I encouraged him to contact a provider on the list and schedule an appointment. He verbalized understanding.

## 2016-09-30 NOTE — Telephone Encounter (Signed)
-----   Message from [name], MD sent at 09/29/2016  9:21 AM EDT ----- Can you call to see if patient has seen psychiatry?

## 2016-10-05 ENCOUNTER — Encounter: Payer: Self-pay | Admitting: Gastroenterology

## 2016-10-05 ENCOUNTER — Other Ambulatory Visit (INDEPENDENT_AMBULATORY_CARE_PROVIDER_SITE_OTHER): Payer: BLUE CROSS/BLUE SHIELD

## 2016-10-05 ENCOUNTER — Telehealth: Payer: Self-pay

## 2016-10-05 ENCOUNTER — Ambulatory Visit (INDEPENDENT_AMBULATORY_CARE_PROVIDER_SITE_OTHER): Payer: BLUE CROSS/BLUE SHIELD | Admitting: Gastroenterology

## 2016-10-05 VITALS — BP 128/80 | HR 82 | Ht 68.0 in | Wt 196.2 lb

## 2016-10-05 DIAGNOSIS — K5190 Ulcerative colitis, unspecified, without complications: Secondary | ICD-10-CM | POA: Diagnosis not present

## 2016-10-05 LAB — C-REACTIVE PROTEIN: CRP: 0.9 mg/dL (ref 0.5–20.0)

## 2016-10-05 MED ORDER — BUDESONIDE 9 MG PO TB24: 9.0000 mg | ORAL_TABLET | Freq: Every day | ORAL | 2 refills | Status: DC

## 2016-10-05 NOTE — Telephone Encounter (Signed)
Dr Silverio Decamp, this patient would like to switch to you as a provider.  You currently see his mother.  Ok per Dr Ardis Hughs.  Please advise.

## 2016-10-05 NOTE — Progress Notes (Signed)
10/05/2016 Brian Bates [id number] 1970/08/20  Review of pertinent gastrointestinal problems: 1. Ulcerative colitis; Colonoscopy December 2004, Dr. Lizbeth Bark from [facility], done for bloody diarrhea, findings left-sided colitis to 30 cm, otherwise normal examination to the terminal ileum. Biopsies of the terminal ileum were normal, descending colon and left colon biopsies showed focal active colitis and active chronic colitis consistent with IBD. In the comment section pathologist wrote that "the extension of the inflammation to deeper sections of the biopsies raises the possibility of Crohn's disease however no granulomas or dysplasia were seen, overall more in favor of ulcerative colitis. Established care with Dr. Ardis Hughs 2016, had not been on UC meds for 6-8 years. ESR was elevated.  Colonoscopy 05/2015 showed severe inflammation to 65cm with distinct transition to normal.  Right colon normal (path was normal), TI was normal, left colon path +chronic active colitis. Brian Bates was started on prednisone and lialda however never started the lialda.   HISTORY OF PRESENT ILLNESS:  This is a 46 year old male who has a diagnosis of ulcerative colitis. Brian Bates is here today with Brian Bates husband in Brian Bates sister. Brian Bates tells me that previously Brian Bates'll follow-up with Dr. Watt Climes at [facility]. Had been on sulfasalazine in the past. Switched to Dr. Ardis Hughs and underwent colonoscopy in 05/2015 as stated above.  Was treated with mesalamine and prednisone. Patient had been somewhat noncompliant with medication due to cost, etc. Brian Bates has not been seen in the office here since February 2017.  The patient, Brian Bates husband, and Brian Bates sister tell me that Brian Bates is having severe symptoms related to Brian Bates disease. They say that on a good day Brian Bates is having to visit the bathroom 16 times per day.  Sometimes just passing mucus, however.  Has nocturnal bowel movements. Has accidents/fecal incontinence frequently and has to wear diapers and Brian Bates carries extra  changes of clothing. Is currently on Delzicol at the full dose and still having issues. Denies seeing any blood in Brian Bates stool recently. Brian Bates says that the Delzicol capsules are coming out whole in Brian Bates stool. Brian Bates tells me that Brian Bates did take some of Brian Bates mother's prednisone, tapering dose from 40 mg. Says that the last time Brian Bates had prednisone was about 2 months ago. Labs from February show an elevated white blood cell count at 14.6 (may have been on prednisone at that time), hemoglobin was normal, platelets slightly high at 561, CMP normal, sedimentation rate slightly elevated at 22. They say that Brian Bates disease and Brian Bates symptoms are controlling Brian Bates life and Brian Bates has no quality of life at all right now. Says that Brian Bates feels terrible and it extremely affects Brian Bates mood, understandably.  Brian Bates sister has already checked on the insurance coverage for biologics.    Past Medical History:  Diagnosis Date  . Allergic rhinitis    otc antihistamine  . Bipolar 2 disorder [facility])    diagnosed by psychiatry Dr. Reece Levy- 6 years ago  . Chicken pox   . GERD (gastroesophageal reflux disease)    prn  . Ulcerative colitis [facility])    Past Surgical History:  Procedure Laterality Date  . COLONOSCOPY     x2  . COLONOSCOPY WITH PROPOFOL N/A 05/29/2015   Procedure: COLONOSCOPY WITH PROPOFOL;  Surgeon: [name], MD;  Location: [facility];  Service: Endoscopy;  Laterality: N/A;  . TONSILECTOMY/ADENOIDECTOMY WITH MYRINGOTOMY      reports that Brian Bates quit smoking about 22 years ago. Brian Bates smoking use included Cigarettes. Brian Bates has a 1.00 pack-year smoking  history. Brian Bates has never used smokeless tobacco. Brian Bates reports that Brian Bates drinks alcohol. Brian Bates reports that Brian Bates does not use drugs. family history includes Alcohol abuse in Brian Bates maternal grandfather; Asthma in Brian Bates brother; Bipolar disorder in Brian Bates mother and sister; Cataracts in Brian Bates father; Esophagitis in Brian Bates mother; Heart attack in Brian Bates mother; Hypertension in Brian Bates mother; Lupus in Brian Bates mother; Other in Brian Bates  sister; Sjogren's syndrome in Brian Bates mother; Stroke in Brian Bates mother. No Known Allergies    Outpatient Encounter Prescriptions as of 10/05/2016  Medication Sig  . Budesonide (UCERIS) 9 MG TB24 Take 9 mg by mouth daily.  . DELZICOL 400 MG CPDR DR capsule TAKE 4 CAPSULES (1,600 MG TOTAL) BY MOUTH 4 (FOUR) TIMES DAILY.  Marland Kitchen predniSONE (DELTASONE) 10 MG tablet TAKE 1 TABLET BY MOUTH 2 TIMES DAILY WITH A MEAL.   No facility-administered encounter medications on file as of 10/05/2016.      REVIEW OF SYSTEMS  : All other systems reviewed and negative except where noted in the History of Present Illness.   PHYSICAL EXAM: BP 128/80   Pulse 82   Ht 5' 8"  (1.727 m)   Wt 196 lb 3.2 oz (89 kg)   SpO2 98%   BMI 29.83 kg/m  General: Well developed white male in no acute distress Head: Normocephalic and atraumatic Eyes:  Sclerae anicteric, conjunctiva pink. Ears: Normal auditory acuity Lungs: Clear throughout to auscultation Heart: Regular rate and rhythm Abdomen: Soft, non-distended. Normal bowel sounds.  Non-tender. Musculoskeletal: Symmetrical with no gross deformities  Skin: No lesions on visible extremities Extremities: No edema  Neurological: Alert oriented x 4, grossly non-focal Psychological:  Alert and cooperative. Normal mood and affect  ASSESSMENT AND PLAN: *46 year old male with severe UC as documented on colonoscopy in 05/2015.  And sounds to be having severe symptoms.  Brian Bates has seemed to fail oral mesalamine.  Responds to prednisone but does not do well with side effects.  Needs something more for long-term treatment.  I am going to place him on Uceris 9 mg daily.  Likely will need a biologic and Brian Bates is ready for that at this point.  I am going to check Hep B studies and quantiferon gold.  Will check a CRP as well.    **Of note, patient asking to switch from Dr. Ardis Hughs to Dr. Silverio Decamp.  Pending both of their responses.  I will send this note to both of them for now.  CC:  [name], MD

## 2016-10-05 NOTE — Patient Instructions (Addendum)
We have sent the following medications to your pharmacy for you to pick up at your convenience: Uceris 9mg  daily  Your physician has requested that you go to the basement for the following lab work before leaving today: CRP, Hep B antibody/antigen, Quant Gold  Normal BMI (Body Mass Index- based on height and weight) is between 19 and 25. Your BMI today is Body mass index is 29.83 kg/m. Marland Kitchen Please consider follow up  regarding your BMI with your Primary Care Provider. Low-Fiber Diet Fiber is found in fruits, vegetables, and whole grains. A low-fiber diet restricts fibrous foods that are not digested in the small intestine. A diet containing about 10-15 grams of fiber per day is considered low fiber. Low-fiber diets may be used to:  Promote healing and rest the bowel during intestinal flare-ups.  Prevent blockage of a partially obstructed or narrowed gastrointestinal tract.  Reduce fecal weight and volume.  Slow the movement of feces. You may be on a low-fiber diet as a transitional diet following surgery, after an injury (trauma), or because of a short (acute) or lifelong (chronic) illness. Your health care provider will determine the length of time you need to stay on this diet. What do I need to know about a low-fiber diet? Always check the fiber content on the packaging's Nutrition Facts label, especially on foods from the grains list. Ask your dietitian if you have questions about specific foods that are related to your condition, especially if the food is not listed below. In general, a low-fiber food will have less than 2 g of fiber. What foods can I eat? Grains  All breads and crackers made with white flour. Sweet rolls, doughnuts, waffles, pancakes, [place] toast, bagels. Pretzels, Melba toast, zwieback. Well-cooked cereals, such as cornmeal, farina, or cream cereals. Dry cereals that do not contain whole grains, fruit, or nuts, such as refined corn, wheat, rice, and oat cereals. Potatoes  prepared any way without skins, plain pastas and noodles, refined white rice. Use white flour for baking and making sauces. Use allowed list of grains for casseroles, dumplings, and puddings. Vegetables  Strained tomato and vegetable juices. Fresh lettuce, cucumber, spinach. Well-cooked (no skin or pulp) or canned vegetables, such as asparagus, bean sprouts, beets, carrots, green beans, mushrooms, potatoes, pumpkin, spinach, yellow squash, tomato sauce/puree, turnips, yams, and zucchini. Keep servings limited to  cup. Fruits  All fruit juices except prune juice. Cooked or canned fruits without skin and seeds, such as applesauce, apricots, cherries, fruit cocktail, grapefruit, grapes, mandarin oranges, melons, peaches, pears, pineapple, and plums. Fresh fruits without skin, such as apricots, avocados, bananas, melons, pineapple, nectarines, and peaches. Keep servings limited to  cup or 1 piece. Meat and Other Protein Sources  Ground or well-cooked tender beef, ham, veal, lamb, pork, or poultry. Eggs, plain cheese. Fish, oysters, shrimp, lobster, and other seafood. Liver, organ meats. Smooth nut butters. Dairy  All milk products and alternative dairy substitutes, such as soy, rice, almond, and coconut, not containing added whole nuts, seeds, or added fruit. Beverages  Decaf coffee, fruit, and vegetable juices or smoothies (small amounts, with no pulp or skins, and with fruits from allowed list), sports drinks, herbal tea. Condiments  Ketchup, mustard, vinegar, cream sauce, cheese sauce, cocoa powder. Spices in moderation, such as allspice, basil, bay leaves, celery powder or leaves, cinnamon, cumin powder, curry powder, ginger, mace, marjoram, onion or garlic powder, oregano, paprika, parsley flakes, ground pepper, rosemary, sage, savory, tarragon, thyme, and turmeric. Sweets and Desserts  Plain cakes and cookies, pie made with allowed fruit, pudding, custard, cream pie. Gelatin, fruit, ice, sherbet,  frozen ice pops. Ice cream, ice milk without nuts. Plain hard candy, honey, jelly, molasses, syrup, sugar, chocolate syrup, gumdrops, marshmallows. Limit overall sugar intake. Fats and Oil  Margarine, butter, cream, mayonnaise, salad oils, plain salad dressings made from allowed foods. Choose healthy fats such as olive oil, canola oil, and omega-3 fatty acids (such as found in salmon or tuna) when possible. Other  Bouillon, broth, or cream soups made from allowed foods. Any strained soup. Casseroles or mixed dishes made with allowed foods. The items listed above may not be a complete list of recommended foods or beverages. Contact your dietitian for more options.  What foods are not recommended? Grains  All whole wheat and whole grain breads and crackers. Multigrains, rye, bran seeds, nuts, or coconut. Cereals containing whole grains, multigrains, bran, coconut, nuts, raisins. Cooked or dry oatmeal, steel-cut oats. Coarse wheat cereals, granola. Cereals advertised as high fiber. Potato skins. Whole grain pasta, wild or brown rice. Popcorn. Coconut flour. Bran, buckwheat, corn bread, multigrains, rye, wheat germ. Vegetables  Fresh, cooked or canned vegetables, such as artichokes, asparagus, beet greens, broccoli, Brussels sprouts, cabbage, celery, cauliflower, corn, eggplant, kale, legumes or beans, okra, peas, and tomatoes. Avoid large servings of any vegetables, especially raw vegetables. Fruits  Fresh fruits, such as apples with or without skin, berries, cherries, figs, grapes, grapefruit, guavas, kiwis, mangoes, oranges, papayas, pears, persimmons, pineapple, and pomegranate. Prune juice and juices with pulp, stewed or dried prunes. Dried fruits, dates, raisins. Fruit seeds or skins. Avoid large servings of all fresh fruits. Meats and Other Protein Sources  Tough, fibrous meats with gristle. Chunky nut butter. Cheese made with seeds, nuts, or other foods not recommended. Nuts, seeds, legumes (beans,  including baked beans), dried peas, beans, lentils. Dairy  Yogurt or cheese that contains nuts, seeds, or added fruit. Beverages  Fruit juices with high pulp, prune juice. Caffeinated coffee and teas. Condiments  Coconut, maple syrup, pickles, olives. Sweets and Desserts  Desserts, cookies, or candies that contain nuts or coconut, chunky peanut butter, dried fruits. Jams, preserves with seeds, marmalade. Large amounts of sugar and sweets. Any other dessert made with fruits from the not recommended list. Other  Soups made from vegetables that are not recommended or that contain other foods not recommended. The items listed above may not be a complete list of foods and beverages to avoid. Contact your dietitian for more information.  This information is not intended to replace advice given to you by your health care provider. Make sure you discuss any questions you have with your health care provider. Document Released: 11/27/2001 Document Revised: 11/13/2015 Document Reviewed: 04/30/2013 Elsevier Interactive Patient Education  2017 [employer]  Lactose-Free Diet, Adult If you have lactose intolerance, you are not able to digest lactose. Lactose is a natural sugar found mainly in milk and milk products. You may need to avoid all foods and beverages that contain lactose. A lactose-free diet can help you do this. What do I need to know about this diet?  Do not consume foods, beverages, vitamins, minerals, or medicines with lactose. Read ingredients lists carefully.  Look for the words "lactose-free" on labels.  Use lactase enzyme drops or tablets as directed by your health care provider.  Use lactose-free milk or a milk alternative, such as soy milk, for drinking and cooking.  Make sure you get enough calcium and vitamin D in your diet.  A lactose-free eating plan can be lacking in these important nutrients.  Take calcium and vitamin D supplements as directed by your health care provider.  Talk to your provider about supplements if you are not able to get enough calcium and vitamin D from food. Which foods have lactose? Lactose is found in:  Milk and foods made from milk.  Yogurt.  Cheese.  Butter.  Margarine.  Sour cream.  Cream.  Whipped toppings and nondairy creamers.  Ice cream and other milk-based desserts. Lactose is also found in foods or products made with milk or milk ingredients. To find out whether a food contains milk or a milk ingredient, look at the ingredients list. Avoid foods with the statement "May contain milk" and foods that contain:  Butter.  Cream.  Milk.  Milk solids.  Milk powder.  Whey.  Curd.  Caseinate.  Lactose.  Lactalbumin.  Lactoglobulin. What are some alternatives to milk and foods made with milk products?  Lactose-free milk.  Soy milk with added calcium and vitamin D.  Almond, coconut, or rice milk with added calcium and vitamin D. Note that these are low in protein.  Soy products, such as soy yogurt, soy cheese, soy ice cream, and soy-based sour cream. Which foods can I eat? Grains  Breads and rolls made without milk, such as [place], [place], or [place] bread, bagels, pita, and [employer]. Corn tortillas, corn meal, grits, and polenta. Crackers without lactose or milk solids, such as soda crackers and graham crackers. Cooked or dry cereals without lactose or milk solids. Pasta, quinoa, couscous, barley, oats, bulgur, farro, rice, wild rice, or other grains prepared without milk or lactose. Plain popcorn. Vegetables  Fresh, frozen, and canned vegetables without cheese, cream, or butter sauces. Fruits  All fresh, canned, frozen, or dried fruits that are not processed with lactose. Meats and Other Protein Sources  Plain beef, chicken, fish, [place], lamb, veal, pork, wild game, or ham. Kosher-prepared meat products. Strained or junior meats that do not contain milk. Eggs. Soy meat substitutes. Beans, lentils, and  hummus. Tofu. Nuts and seeds. Peanut or other nut butters without lactose. Soups, casseroles, and mixed dishes without cheese, cream, or milk. Dairy  Lactose-free milk. Soy, rice, or almond milk with added calcium and vitamin D. Soy cheese and yogurt. Beverages  Carbonated drinks. Tea. Coffee, freeze-dried coffee, and some instant coffees. Fruit and vegetable juices. Condiments  Soy sauce. Carob powder. Olives. Gravy made with water. Baker's cocoa. Angie Fava. Pure seasonings and spices. Ketchup. Mustard. Bouillon. Broth. Sweets and Desserts  Water and fruit ices. Gelatin. Cookies, pies, or cakes made from allowed ingredients, such as angel food cake. Pudding made with water or a milk substitute. Lactose-free tofu desserts. Soy, coconut milk, or rice-milk-based frozen desserts. Sugar. Honey. Jam, jelly, and marmalade. Molasses. Pure sugar candy. Dark chocolate without milk. Marshmallows. Fats and Oils  Margarines and salad dressings that do not contain milk. Berniece Salines. Vegetable oils. Shortening. Mayonnaise. Soy or coconut-based cream. The items listed above may not be a complete list of recommended foods or beverages. Contact your dietitian for more options.  Which foods are not recommended? Grains  Breads and rolls that contain milk. Toaster pastries. Muffins, biscuits, waffles, cornbread, and pancakes. These can be prepared at home, commercial, or from mixes. Sweet rolls, donuts, English muffins, fry bread, lefse, flour tortillas with lactose, or [place] toast made with milk or milk ingredients. Crackers that contain lactose. Corn curls. Cooked or dry cereals with lactose. Vegetables  Creamed or breaded vegetables.  Vegetables in a cheese or butter sauce or with lactose-containing margarines. Instant potatoes. [place] fries. Scalloped or au gratin potatoes. Fruits  None. Meats and Other Protein Sources  Scrambled eggs, omelets, and souffles that contain milk. Creamed or breaded meat, fish, chicken, or  [place]. Sausage products, such as wieners and liver sausage. Cold cuts that contain milk solids. Cheese, cottage cheese, ricotta cheese, and cheese spreads. Lasagna and macaroni and cheese. Pizza. Peanut or other nut butters with added milk solids. Casseroles or mixed dishes containing milk or cheese. Dairy  All dairy products, including milk, goat's milk, buttermilk, kefir, acidophilus milk, flavored milk, evaporated milk, condensed milk, dulce de [place], eggnog, yogurt, cheese, and cheese spreads. Beverages  Hot chocolate. Cocoa with lactose. Instant iced teas. Powdered fruit drinks. Smoothies made with milk or yogurt. Condiments  Chewing gum that has lactose. Cocoa that has lactose. Spice blends if they contain milk products. Artificial sweeteners that contain lactose. Nondairy creamers. Sweets and Desserts  Ice cream, ice milk, gelato, sherbet, and frozen yogurt. Custard, pudding, and mousse. Cake, cream pies, cookies, and other desserts containing milk, cream, cream cheese, or milk chocolate. Pie crust made with milk-containing margarine or butter. Reduced-calorie desserts made with a sugar substitute that contains lactose. Toffee and butterscotch. Milk, white, or dark chocolate that contains milk. Fudge. Caramel. Fats and Oils  Margarines and salad dressings that contain milk or cheese. Cream. Half and half. Cream cheese. Sour cream. Chip dips made with sour cream or yogurt. The items listed above may not be a complete list of foods and beverages to avoid. Contact your dietitian for more information.  Am I getting enough calcium? Calcium is found in many foods that contain lactose and is important for bone health. The amount of calcium you need depends on your age:  Adults younger than 50 years: 1000 mg of calcium a day.  Adults older than 50 years: 1200 mg of calcium a day. If you are not getting enough calcium, other calcium sources include:  Orange juice with calcium added. There are  300-350 mg of calcium in 1 cup of orange juice.  Sardines with edible bones. There are 325 mg of calcium in 3 oz of sardines.  Calcium-fortified soy milk. There are 300-400 mg of calcium in 1 cup of calcium-fortified soy milk.  Calcium-fortified rice or almond milk. There are 300 mg of calcium in 1 cup of calcium-fortified rice or almond milk.  Canned salmon with edible bones. There are 180 mg of calcium in 3 oz of canned salmon with edible bones.  Calcium-fortified breakfast cereals. There are [phone] mg of calcium in calcium-fortified breakfast cereals.  Tofu set with calcium sulfate. There are 250 mg of calcium in  cup of tofu set with calcium sulfate.  Spinach, cooked. There are 145 mg of calcium in  cup of cooked spinach.  Edamame, cooked. There are 130 mg of calcium in  cup of cooked edamame.  Collard greens, cooked. There are 125 mg of calcium in  cup of cooked collard greens.  Kale, frozen or cooked. There are 90 mg of calcium in  cup of cooked or frozen kale.  Almonds. There are 95 mg of calcium in  cup of almonds.  Broccoli, cooked. There are 60 mg of calcium in 1 cup of cooked broccoli. This information is not intended to replace advice given to you by your health care provider. Make sure you discuss any questions you have with your health care provider. Document Released: 11/27/2001 Document Revised:  11/13/2015 Document Reviewed: 09/07/2013 Elsevier Interactive Patient Education  2017 [employer].  Thank you

## 2016-10-05 NOTE — Telephone Encounter (Signed)
Dr. Ardis Hughs, patient would like to switch his care to Dr. Silverio Decamp.  Patient says he was prescribed the wrong amount of medication, miscommunication and patients mother sees Dr. Silverio Decamp.  Please advise.

## 2016-10-05 NOTE — Telephone Encounter (Signed)
ok 

## 2016-10-05 NOTE — Progress Notes (Signed)
I agree with the above note, plan.  OK to switch to Dr. Silverio Decamp if that is OK with her.

## 2016-10-05 NOTE — Telephone Encounter (Signed)
That is OK with me if it is OK with Dr. Silverio Decamp.  thanks

## 2016-10-06 LAB — HEPATITIS B SURFACE ANTIGEN: Hepatitis B Surface Ag: NEGATIVE

## 2016-10-06 LAB — HEPATITIS B SURFACE ANTIBODY,QUALITATIVE: Hep B S Ab: POSITIVE — AB

## 2016-10-07 LAB — QUANTIFERON TB GOLD ASSAY (BLOOD)
INTERFERON GAMMA RELEASE ASSAY: NEGATIVE
Mitogen-Nil: 5.68 IU/mL
QUANTIFERON TB AG MINUS NIL: 0.01 [IU]/mL
Quantiferon Nil Value: 0.01 IU/mL

## 2016-10-08 NOTE — Progress Notes (Signed)
Reviewed and agree with documentation and assessment and plan. K. Veena Ivanka Kirshner , MD   

## 2016-10-09 ENCOUNTER — Emergency Department (HOSPITAL_COMMUNITY)
Admission: EM | Admit: 2016-10-09 | Discharge: 2016-10-10 | Disposition: A | Discharge status: Home / Self Care | Payer: BLUE CROSS/BLUE SHIELD | Attending: Emergency Medicine | Admitting: Emergency Medicine

## 2016-10-09 ENCOUNTER — Encounter (HOSPITAL_COMMUNITY): Payer: Self-pay | Admitting: Emergency Medicine

## 2016-10-09 ENCOUNTER — Emergency Department (HOSPITAL_COMMUNITY): Payer: BLUE CROSS/BLUE SHIELD

## 2016-10-09 DIAGNOSIS — K5190 Ulcerative colitis, unspecified, without complications: Secondary | ICD-10-CM | POA: Insufficient documentation

## 2016-10-09 DIAGNOSIS — Z87891 Personal history of nicotine dependence: Secondary | ICD-10-CM | POA: Diagnosis not present

## 2016-10-09 DIAGNOSIS — R197 Diarrhea, unspecified: Secondary | ICD-10-CM | POA: Diagnosis not present

## 2016-10-09 DIAGNOSIS — Z79899 Other long term (current) drug therapy: Secondary | ICD-10-CM | POA: Insufficient documentation

## 2016-10-09 DIAGNOSIS — R1030 Lower abdominal pain, unspecified: Secondary | ICD-10-CM | POA: Diagnosis present

## 2016-10-09 LAB — COMPREHENSIVE METABOLIC PANEL
ALT: 18 U/L (ref 17–63)
ANION GAP: 9 (ref 5–15)
AST: 24 U/L (ref 15–41)
Albumin: 3.6 g/dL (ref 3.5–5.0)
Alkaline Phosphatase: 75 U/L (ref 38–126)
BUN: 9 mg/dL (ref 6–20)
CALCIUM: 9.2 mg/dL (ref 8.9–10.3)
CHLORIDE: 104 mmol/L (ref 101–111)
CO2: 24 mmol/L (ref 22–32)
Creatinine, Ser: 1.05 mg/dL (ref 0.61–1.24)
GFR calc non Af Amer: >60 mL/min (ref >60)
Glucose, Bld: 108 mg/dL — ABNORMAL HIGH (ref 65–99)
Potassium: 3.9 mmol/L (ref 3.5–5.1)
SODIUM: 137 mmol/L (ref 135–145)
Total Bilirubin: 0.6 mg/dL (ref 0.3–1.2)
Total Protein: 7.2 g/dL (ref 6.5–8.1)

## 2016-10-09 LAB — CBC WITH DIFFERENTIAL/PLATELET
BASOS ABS: 0.1 10*3/uL (ref 0.0–0.1)
BASOS PCT: 1 %
EOS ABS: 0.4 10*3/uL (ref 0.0–0.7)
EOS PCT: 2 %
HCT: 35.4 % — ABNORMAL LOW (ref 39.0–52.0)
Hemoglobin: 11.7 g/dL — ABNORMAL LOW (ref 13.0–17.0)
Lymphocytes Relative: 9 %
Lymphs Abs: 1.5 10*3/uL (ref 0.7–4.0)
MCH: 28 pg (ref 26.0–34.0)
MCHC: 33.1 g/dL (ref 30.0–36.0)
MCV: 84.7 fL (ref 78.0–100.0)
MONO ABS: 1.4 10*3/uL — AB (ref 0.1–1.0)
MONOS PCT: 9 %
Neutro Abs: 12.7 10*3/uL — ABNORMAL HIGH (ref 1.7–7.7)
Neutrophils Relative %: 79 %
Platelets: 428 10*3/uL — ABNORMAL HIGH (ref 150–400)
RBC: 4.18 MIL/uL — ABNORMAL LOW (ref 4.22–5.81)
RDW: 14.5 % (ref 11.5–15.5)
WBC: 16.1 10*3/uL — ABNORMAL HIGH (ref 4.0–10.5)

## 2016-10-09 LAB — LIPASE, BLOOD: Lipase: 24 U/L (ref 11–51)

## 2016-10-09 MED ORDER — IOPAMIDOL (ISOVUE-300) INJECTION 61%
INTRAVENOUS | Status: AC
  Filled 2016-10-09: qty 30

## 2016-10-09 MED ORDER — SODIUM CHLORIDE 0.9 % IV BOLUS (SEPSIS)
500.0000 mL | Freq: Once | INTRAVENOUS | Status: AC
  Administered 2016-10-09: 500 mL via INTRAVENOUS

## 2016-10-09 MED ORDER — SODIUM CHLORIDE 0.9 % IV SOLN
Freq: Once | INTRAVENOUS | Status: AC
  Administered 2016-10-09: 22:00:00 via INTRAVENOUS

## 2016-10-09 MED ORDER — FENTANYL CITRATE (PF) 100 MCG/2ML IJ SOLN
50.0000 ug | Freq: Once | INTRAMUSCULAR | Status: AC | PRN
  Administered 2016-10-09: 50 ug via INTRAVENOUS
  Filled 2016-10-09: qty 2

## 2016-10-09 MED ORDER — ONDANSETRON HCL 4 MG/2ML IJ SOLN
4.0000 mg | Freq: Once | INTRAMUSCULAR | Status: AC | PRN
  Administered 2016-10-09: 4 mg via INTRAVENOUS
  Filled 2016-10-09: qty 2

## 2016-10-09 NOTE — ED Triage Notes (Signed)
Pt reports to having diarrhea and abd pain that started today. Pt reports hx of ulcerative colitis. Pt states white discharge along with red jelly stool.

## 2016-10-09 NOTE — ED Notes (Signed)
Pt has hx of ulcerative colitis, started uceris, a new medication on Tuesday. Pt c/o pain along with rectal hemorrhage and milky discharge. Pt had no previous pain associated with his UC.

## 2016-10-09 NOTE — ED Provider Notes (Signed)
[facility] DEPT Provider Note   CSN: [id number] Arrival date & time: 10/09/16  2110     History   Chief Complaint Chief Complaint  Patient presents with  . Abdominal Pain    HPI Brian Bates is a 46 y.o. male. Chief complaint is abdominal pain, history of ulcerative colitis, and bloody stools.  HPI:  Patient with a history of ulcerative colitis. Diagnosed in 04. Seeing this last week and had medication adjustments with follow-up at GI physician. Abdominal pain sudden and sharp in the left abdomen tonight and several bloody jelly looking" stools tonight. Pain-free upon arrival here after an episode at home.  2004: She'll diagnosis. Followed at 7. Positive colonoscopy. Positive biopsy. Was on sulfasalazine.  Was minimally symptomatically over several years. Intermittently compliant with medications.  2016 seen at [facility]. Follow-up with Dr. Ardis Hughs. Had repeat colonoscopy +65 cm.  Prescribed Prednisone, and Lialza.  4/17:  Seen as new intake patient at [facility]  with new provider. Has most recently been taking  *Dalzicol/Mesalamine 4/day  *Prednisone--apparently had been getting from his mother   and taking as needed, varying doses  Patient and his sister have "put in" with his health insurance for approval to undergo a biologic. Was prescribed Ucerisis 9 mg per day and has been on this for 4 days    Past Medical History:  Diagnosis Date  . Allergic rhinitis    otc antihistamine  . Bipolar 2 disorder [facility])    diagnosed by psychiatry Dr. Reece Levy- 6 years ago  . Chicken pox   . GERD (gastroesophageal reflux disease)    prn  . Ulcerative colitis [facility])     Patient Active Problem List   Diagnosis Date Noted  . Bipolar 2 disorder ([place])   . GERD (gastroesophageal reflux disease)   . Allergic rhinitis   . Severe ulcerative colitis ([place])     Past Surgical History:  Procedure Laterality Date  . COLONOSCOPY     x2  . COLONOSCOPY WITH PROPOFOL N/A 05/29/2015   Procedure: COLONOSCOPY WITH PROPOFOL;  Surgeon: [name], MD;  Location: [facility];  Service: Endoscopy;  Laterality: N/A;  . TONSILECTOMY/ADENOIDECTOMY WITH MYRINGOTOMY         Home Medications    Prior to Admission medications   Medication Sig Start Date End Date Taking? Authorizing Provider  Budesonide (UCERIS) 9 MG TB24 Take 9 mg by mouth daily. 10/05/16  Yes Jessica D Zehr, PA-C  DELZICOL 400 MG CPDR DR capsule TAKE 4 CAPSULES (1,600 MG TOTAL) BY MOUTH 4 (FOUR) TIMES DAILY. 11/18/15  Yes [name], MD  predniSONE (DELTASONE) 10 MG tablet TAKE 1 TABLET BY MOUTH 2 TIMES DAILY WITH A MEAL. Patient taking differently: TAKE 40MG  BY MOUTH DAILY AS NEEDED 03/22/16  Yes [name], MD    Family History Family History  Problem Relation Age of Onset  . Esophagitis Mother   . Sjogren's syndrome Mother   . Lupus Mother   . Stroke Mother     in 27s  . Heart attack Mother     in 66s  . Bipolar disorder Mother   . Hypertension Mother   . Cataracts Father   . Other Sister     "bone marrow issue"  . Bipolar disorder Sister   . Asthma Brother   . Alcohol abuse Maternal Grandfather     several issues on this side  . Colon cancer Neg Hx     Social History Social History  Substance Use Topics  .  Smoking status: Former Smoker    Packs/day: 1.00    Years: 1.00    Types: Cigarettes    Quit date: 06/21/1994  . Smokeless tobacco: Never Used  . Alcohol use 0.0 oz/week     Comment:  2 drinks a day prior- supportive of alcoholic husband so rarely now     Allergies   Patient has no known allergies.   Review of Systems Review of Systems  Constitutional: Negative for appetite change, chills, diaphoresis, fatigue and fever.  HENT: Negative for mouth sores, sore throat and trouble swallowing.   Eyes: Negative for visual disturbance.  Respiratory: Negative for cough, chest tightness, shortness of breath and wheezing.   Cardiovascular: Negative for chest pain.    Gastrointestinal: Positive for abdominal pain and blood in stool. Negative for abdominal distention, diarrhea, nausea and vomiting.  Endocrine: Negative for polydipsia, polyphagia and polyuria.  Genitourinary: Negative for dysuria, frequency and hematuria.  Musculoskeletal: Negative for gait problem.  Skin: Negative for color change, pallor and rash.  Neurological: Negative for dizziness, syncope, light-headedness and headaches.  Hematological: Does not bruise/bleed easily.  Psychiatric/Behavioral: Negative for behavioral problems and confusion.     Physical Exam Updated Vital Signs BP 97/63   Pulse 82   Temp 98 F (36.7 C) (Oral)   Resp 14   Ht 5\' 8"  (1.727 m)   Wt 195 lb (88.5 kg)   SpO2 98%   BMI 29.65 kg/m   Physical Exam  Constitutional: He is oriented to person, place, and time. He appears well-developed and well-nourished. No distress.  Awake alert. Does not appear toxic. Currently pain-free.  HENT:  Head: Normocephalic.  Eyes: Conjunctivae are normal. Pupils are equal, round, and reactive to light. No scleral icterus.  Neck: Normal range of motion. Neck supple. No thyromegaly present.  Cardiovascular: Normal rate and regular rhythm.  Exam reveals no gallop and no friction rub.   No murmur heard. Pulmonary/Chest: Effort normal and breath sounds normal. No respiratory distress. He has no wheezes. He has no rales.  Abdominal: Soft. Bowel sounds are normal. He exhibits no distension. There is no tenderness. There is no rebound.  Soft benign abdomen. Nondistended. No peritoneal irritation.  Musculoskeletal: Normal range of motion.  Neurological: He is alert and oriented to person, place, and time.  Skin: Skin is warm and dry. No rash noted.  Psychiatric: He has a normal mood and affect. His behavior is normal.     ED Treatments / Results  Labs (all labs ordered are listed, but only abnormal results are displayed) Labs Reviewed  CBC WITH DIFFERENTIAL/PLATELET -  Abnormal; Notable for the following:       Result Value   WBC 16.1 (*)    RBC 4.18 (*)    Hemoglobin 11.7 (*)    HCT 35.4 (*)    Platelets 428 (*)    Neutro Abs 12.7 (*)    Monocytes Absolute 1.4 (*)    All other components within normal limits  COMPREHENSIVE METABOLIC PANEL - Abnormal; Notable for the following:    Glucose, Bld 108 (*)    All other components within normal limits  SEDIMENTATION RATE - Abnormal; Notable for the following:    Sed Rate 38 (*)    All other components within normal limits  LIPASE, BLOOD    EKG  EKG Interpretation None       Radiology No results found.  Procedures Procedures (including critical care time)  Medications Ordered in ED Medications  iopamidol (ISOVUE-300) 61 %  injection (not administered)  methylPREDNISolone sodium succinate (SOLU-MEDROL) 125 mg/2 mL injection 125 mg (not administered)  iopamidol (ISOVUE-300) 61 % injection (not administered)  sodium chloride 0.9 % bolus 500 mL (0 mLs Intravenous Stopped 10/09/16 2342)  0.9 %  sodium chloride infusion ( Intravenous New Bag/Given 10/09/16 2215)  ondansetron (ZOFRAN) injection 4 mg (4 mg Intravenous Given 10/09/16 2302)  fentaNYL (SUBLIMAZE) injection 50 mcg (50 mcg Intravenous Given 10/09/16 2303)  iopamidol (ISOVUE-300) 61 % injection 100 mL (100 mLs Intravenous Contrast Given 10/10/16 0034)     Initial Impression / Assessment and Plan / ED Course  I have reviewed the triage vital signs and the nursing notes.  Pertinent labs & imaging results that were available during my care of the patient were reviewed by me and considered in my medical decision making (see chart for details).   plan sedimentation rate, lab evaluation. IV fluids. When necessary medications for pain or nausea as needed. CT to rule out perforation. We'll reevaluate.  Final Clinical Impressions(s) / ED Diagnoses   Final diagnoses:  Ulcerative colitis without complications, unspecified location ([place])    No  complications per my read on CT. Await formal read. If +UC without acute complication, plan medrol taper, Cipro/Flagyl, GI FU  New Prescriptions New Prescriptions   No medications on file     Tanna Furry, MD 10/10/16 [phone]

## 2016-10-10 ENCOUNTER — Encounter (HOSPITAL_COMMUNITY): Payer: Self-pay | Admitting: Radiology

## 2016-10-10 ENCOUNTER — Emergency Department (HOSPITAL_COMMUNITY): Payer: BLUE CROSS/BLUE SHIELD

## 2016-10-10 DIAGNOSIS — R197 Diarrhea, unspecified: Secondary | ICD-10-CM | POA: Diagnosis not present

## 2016-10-10 LAB — SEDIMENTATION RATE: SED RATE: 38 mm/h — AB (ref 0–16)

## 2016-10-10 MED ORDER — IOPAMIDOL (ISOVUE-300) INJECTION 61%
INTRAVENOUS | Status: AC
  Filled 2016-10-10: qty 100

## 2016-10-10 MED ORDER — METRONIDAZOLE 500 MG PO TABS
500.0000 mg | ORAL_TABLET | Freq: Once | ORAL | Status: AC
  Administered 2016-10-10: 500 mg via ORAL
  Filled 2016-10-10: qty 1

## 2016-10-10 MED ORDER — METHYLPREDNISOLONE 4 MG PO TBPK
ORAL_TABLET | ORAL | 0 refills | Status: DC
Start: 2016-10-10 — End: 2016-10-25

## 2016-10-10 MED ORDER — IOPAMIDOL (ISOVUE-300) INJECTION 61%
100.0000 mL | Freq: Once | INTRAVENOUS | Status: AC | PRN
  Administered 2016-10-10: 100 mL via INTRAVENOUS

## 2016-10-10 MED ORDER — CIPROFLOXACIN HCL 500 MG PO TABS
500.0000 mg | ORAL_TABLET | Freq: Two times a day (BID) | ORAL | 0 refills | Status: DC
Start: 2016-10-10 — End: 2016-10-25

## 2016-10-10 MED ORDER — METHYLPREDNISOLONE SODIUM SUCC 125 MG IJ SOLR
125.0000 mg | Freq: Once | INTRAMUSCULAR | Status: AC
  Administered 2016-10-10: 125 mg via INTRAVENOUS
  Filled 2016-10-10: qty 2

## 2016-10-10 MED ORDER — METRONIDAZOLE 500 MG PO TABS: 500.0000 mg | ORAL_TABLET | Freq: Two times a day (BID) | ORAL | 0 refills | Status: DC

## 2016-10-10 MED ORDER — METHYLPREDNISOLONE SODIUM SUCC 125 MG IJ SOLR: 125.0000 mg | Freq: Once | INTRAMUSCULAR | Status: DC

## 2016-10-10 MED ORDER — CIPROFLOXACIN HCL 500 MG PO TABS
500.0000 mg | ORAL_TABLET | Freq: Once | ORAL | Status: AC
  Administered 2016-10-10: 500 mg via ORAL
  Filled 2016-10-10: qty 1

## 2016-10-10 NOTE — Discharge Instructions (Signed)
Hold Uceris until finished with Medrol taper over 6 days. Call [facility] office for update Monday.

## 2016-10-11 ENCOUNTER — Telehealth: Payer: Self-pay

## 2016-10-11 NOTE — Telephone Encounter (Signed)
Left a message to call back.

## 2016-10-11 NOTE — Telephone Encounter (Signed)
-----   Message from Mauri Pole, MD sent at 10/08/2016  4:40 PM EDT ----- Can you please ask him to do C.diff to exclude it as it can also contribute to UC flare. If negative, can plan to start anti TNF. He will also need colonoscopy to evaluate disease activity.Thanks

## 2016-10-11 NOTE — Telephone Encounter (Signed)
Ok please schedule patient for colonoscopy and follow up office visit. Thanks

## 2016-10-11 NOTE — Telephone Encounter (Signed)
Spoke with the patient who states he is having solid formed stool as of today. No urgency, bloody or frequent bowel movements. He was put on antibiotics at ER. Please advise.

## 2016-10-12 NOTE — Telephone Encounter (Signed)
Brian Pole, MD  Loralie Champagne, PA-C; Greggory Keen, LPN        Patient said he is no longer having diarrhea, so didn't do C.diff. I wanted to make sure he doesn't have CMV infection causing the flare. We can go ahead initiate the process for him to start anti TNF but feel colonoscopy will be helpful in assessing disease activity to see what it looks like now after steroids and mesalamine.  Thanks  [employer]

## 2016-10-14 ENCOUNTER — Ambulatory Visit (AMBULATORY_SURGERY_CENTER): Payer: Self-pay

## 2016-10-14 ENCOUNTER — Ambulatory Visit (HOSPITAL_COMMUNITY)
Admission: RE | Admit: 2016-10-14 | Discharge: 2016-10-14 | Disposition: A | Discharge status: Home / Self Care | Payer: BLUE CROSS/BLUE SHIELD | Attending: Psychiatry | Admitting: Psychiatry

## 2016-10-14 VITALS — Ht 68.0 in | Wt 197.4 lb

## 2016-10-14 DIAGNOSIS — K5190 Ulcerative colitis, unspecified, without complications: Secondary | ICD-10-CM

## 2016-10-14 DIAGNOSIS — F329 Major depressive disorder, single episode, unspecified: Secondary | ICD-10-CM | POA: Diagnosis not present

## 2016-10-14 MED ORDER — NA SULFATE-K SULFATE-MG SULF 17.5-3.13-1.6 GM/177ML PO SOLN: ORAL | 0 refills | Status: DC
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
## 2016-10-14 NOTE — H&P (Signed)
Behavioral Health Medical Screening Exam  SAID Brian Bates is an 46 y.o. male.  Total Time spent with patient: 20 minutes  Psychiatric Specialty Exam: Physical Exam  Constitutional: He is oriented to person, place, and time. He appears well-developed and well-nourished.  HENT:  Head: Normocephalic.  Right Ear: External ear normal.  Left Ear: External ear normal.  Neck: Normal range of motion. Neck supple.  Cardiovascular: Normal rate, regular rhythm, normal heart sounds and intact distal pulses.   Respiratory: Effort normal and breath sounds normal.  GI: Soft. Bowel sounds are normal.  Musculoskeletal: Normal range of motion.  Neurological: He is alert and oriented to person, place, and time.  Skin: Skin is warm and dry.    Review of Systems  Psychiatric/Behavioral: Positive for depression. Negative for hallucinations, memory loss, substance abuse and suicidal ideas. The patient is not nervous/anxious and does not have insomnia.   All other systems reviewed and are negative.   Blood pressure 127/60, pulse 68, temperature 98.2 F (36.8 C), temperature source Oral, resp. rate 18, SpO2 100 %.There is no height or weight on file to calculate BMI.  General Appearance: Casual and Fairly Groomed  Eye Contact:  Good  Speech:  Clear and Coherent and Normal Rate  Volume:  Normal  Mood:  Anxious and Depressed  Affect:  Congruent and Depressed  Thought Process:  Coherent, Goal Directed and Linear  Orientation:  Full (Time, Place, and Person)  Thought Content:  Logical  Suicidal Thoughts:  No  Homicidal Thoughts:  No  Memory:  Immediate;   Good Recent;   Good Remote;   Fair  Judgement:  Good  Insight:  Present  Psychomotor Activity:  Normal  Concentration: Concentration: Good and Attention Span: Good  Recall:  Good  Fund of Knowledge:Good  Language: Good  Akathisia:  No  Handed:  Right  AIMS (if indicated):     Assets:  Communication Skills Desire for Improvement Financial  Resources/Insurance Housing Intimacy Resilience Social Support Transportation  Sleep:       Musculoskeletal: Strength & Muscle Tone: within normal limits Gait & Station: normal Patient leans: N/A  Blood pressure 127/60, pulse 68, temperature 98.2 F (36.8 C), temperature source Oral, resp. rate 18, SpO2 100 %.  Recommendations:  Based on my evaluation the patient does not appear to have an emergency medical condition.  Ethelene Hal, NP 10/14/2016, 1:41 PM

## 2016-10-14 NOTE — Progress Notes (Signed)
Per pt, no allergies to soy or egg products.Pt not taking any weight loss meds or using  O2 at home.   Pt refused Emmi video. 

## 2016-10-15 ENCOUNTER — Encounter: Payer: Self-pay | Admitting: Gastroenterology

## 2016-10-25 ENCOUNTER — Encounter: Payer: Self-pay | Admitting: Gastroenterology

## 2016-10-25 ENCOUNTER — Ambulatory Visit (AMBULATORY_SURGERY_CENTER): Payer: BLUE CROSS/BLUE SHIELD | Admitting: Gastroenterology

## 2016-10-25 VITALS — BP 102/44 | HR 56 | Temp 97.8°F | Resp 8 | Ht 68.0 in | Wt 197.0 lb

## 2016-10-25 DIAGNOSIS — K5190 Ulcerative colitis, unspecified, without complications: Secondary | ICD-10-CM | POA: Diagnosis not present

## 2016-10-25 DIAGNOSIS — D123 Benign neoplasm of transverse colon: Secondary | ICD-10-CM

## 2016-10-25 DIAGNOSIS — K529 Noninfective gastroenteritis and colitis, unspecified: Secondary | ICD-10-CM | POA: Diagnosis not present

## 2016-10-25 MED ORDER — DIPHENOXYLATE-ATROPINE 2.5-0.025 MG PO TABS: 1.0000 | ORAL_TABLET | Freq: Four times a day (QID) | ORAL | 3 refills | Status: AC | PRN

## 2016-10-25 MED ORDER — DIPHENOXYLATE-ATROPINE 2.5-0.025 MG PO TABS: 1.0000 | ORAL_TABLET | Freq: Four times a day (QID) | ORAL | 3 refills | Status: DC | PRN

## 2016-10-25 MED ORDER — MESALAMINE 4 G RE ENEM: 4.0000 g | ENEMA | RECTAL | 0 refills | Status: DC

## 2016-10-25 MED ORDER — MESALAMINE ER 0.375 G PO CP24: ORAL_CAPSULE | ORAL | 6 refills | Status: DC

## 2016-10-25 MED ORDER — SODIUM CHLORIDE 0.9 % IV SOLN: 500.0000 mL | INTRAVENOUS | Status: AC

## 2016-10-25 NOTE — Progress Notes (Signed)
Called to room to assist during endoscopic procedure.  Patient ID and intended procedure confirmed with present staff. Received instructions for my participation in the procedure from the performing physician.  

## 2016-10-25 NOTE — Patient Instructions (Addendum)
YOU HAD AN ENDOSCOPIC PROCEDURE TODAY AT [facility]:   Refer to the procedure report that was given to you for any specific questions about what was found during the examination.  If the procedure report does not answer your questions, please call your gastroenterologist to clarify.  If you requested that your care partner not be given the details of your procedure findings, then the procedure report has been included in a sealed envelope for you to review at your convenience later.  YOU SHOULD EXPECT: Some feelings of bloating in the abdomen. Passage of more gas than usual.  Walking can help get rid of the air that was put into your GI tract during the procedure and reduce the bloating. If you had a lower endoscopy (such as a colonoscopy or flexible sigmoidoscopy) you may notice spotting of blood in your stool or on the toilet paper. If you underwent a bowel prep for your procedure, you may not have a normal bowel movement for a few days.  Please Note:  You might notice some irritation and congestion in your nose or some drainage.  This is from the oxygen used during your procedure.  There is no need for concern and it should clear up in a day or so.  SYMPTOMS TO REPORT IMMEDIATELY:   Following lower endoscopy (colonoscopy or flexible sigmoidoscopy):  Excessive amounts of blood in the stool  Significant tenderness or worsening of abdominal pains  Swelling of the abdomen that is new, acute  Fever of 100F or higher  For urgent or emergent issues, a gastroenterologist can be reached at any hour by calling [phone].   DIET:  We do recommend a small meal at first, but then you may proceed to your regular diet.  Drink plenty of fluids but you should avoid alcoholic beverages for 24 hours.  MEDICATIONS: Continue present medications. Apriso 0.375 g 4 capsules by mouth every morning. Use Rowasa enemas 1 per rectum every day for 2 weeks. Patient given printed paper prescription  for Lomotil signed by Dr. Silverio Decamp.  Please see handouts given to you by your recovery nurse.  ACTIVITY:  You should plan to take it easy for the rest of today and you should NOT DRIVE or use heavy machinery until tomorrow (because of the sedation medicines used during the test).    FOLLOW UP:  Return to GI clinic in 2 weeks. Our staff will call the number listed on your records the next business day following your procedure to check on you and address any questions or concerns that you may have regarding the information given to you following your procedure. If we do not reach you, we will leave a message.  However, if you are feeling well and you are not experiencing any problems, there is no need to return our call.  We will assume that you have returned to your regular daily activities without incident.  If any biopsies were taken you will be contacted by phone or by letter within the next 1-3 weeks.  Please call us at [phone] if you have not heard about the biopsies in 3 weeks.   Thank you for allowing [place] to provide for your healthcare needs today.   SIGNATURES/CONFIDENTIALITY: You and/or your care partner have signed paperwork which will be entered into your electronic medical record.  These signatures attest to the fact that that the information above on your After Visit Summary has been reviewed and is understood.  Full responsibility of the  confidentiality of this discharge information lies with you and/or your care-partner. 

## 2016-10-25 NOTE — Progress Notes (Signed)
Pt's states no medical or surgical changes since previsit or office visit. maw 

## 2016-10-25 NOTE — Op Note (Signed)
[facility] Patient Name: Brian Bates Procedure Date: 10/25/2016 10:00 AM MRN: [id number] Endoscopist: Mauri Pole , MD Age: 46 Referring MD:  Date of Birth: 08-Jun-1971 Gender: Male Account #: [id number] Procedure:                Colonoscopy Indications:              Determine extent and severity of inflammatory bowel                            disease, Obtain more precise diagnosis of                            inflammatory bowel disease Medicines:                Monitored Anesthesia Care Procedure:                Pre-Anesthesia Assessment:                           - Prior to the procedure, a History and Physical                            was performed, and patient medications and                            allergies were reviewed. The patient's tolerance of                            previous anesthesia was also reviewed. The risks                            and benefits of the procedure and the sedation                            options and risks were discussed with the patient.                            All questions were answered, and informed consent                            was obtained. Prior Anticoagulants: The patient has                            taken no previous anticoagulant or antiplatelet                            agents. ASA Grade Assessment: II - A patient with                            mild systemic disease. After reviewing the risks                            and benefits, the patient was deemed in  satisfactory condition to undergo the procedure.                           After obtaining informed consent, the colonoscope                            was passed under direct vision. Throughout the                            procedure, the patient's blood pressure, pulse, and                            oxygen saturations were monitored continuously. The                            Colonoscope was introduced through  the anus and                            advanced to the the terminal ileum, with                            identification of the appendiceal orifice and IC                            valve. The colonoscopy was performed without                            difficulty. The patient tolerated the procedure                            well. The quality of the bowel preparation was                            good. The terminal ileum, ileocecal valve,                            appendiceal orifice, and rectum were photographed. Scope In: 10:16:33 AM Scope Out: 10:32:55 AM Scope Withdrawal Time: 0 hours 13 minutes 3 seconds  Total Procedure Duration: 0 hours 16 minutes 22 seconds  Findings:                 The perianal and digital rectal examinations were                            normal.                           Inflammation was found in a continuous and                            circumferential pattern from the anus to the                            transverse colon. This was graded as Mayo Score 3                            (  severe, with spontaneous bleeding, ulcerations).                            Biopsies were taken with a cold forceps for                            histology from cecum, ascending, transverse,                            descening, sigmoid colon and rectum.                           The terminal ileum appeared normal.                           Two sessile polyps were found in the mid transverse                            colon. The polyps were 2 to 4 mm in size. These                            polyps were removed with a cold biopsy forceps.                            Resection and retrieval were complete.                           Retroflexion not performed in the rectum due to                            severe inflammation Complications:            No immediate complications. Estimated Blood Loss:     Estimated blood loss was minimal. Impression:               - Severe  (Mayo Score 3) left-sided ulcerative                            colitis. Biopsied.                           - The examined portion of the ileum was normal.                           - Two 2 to 4 mm polyps in the mid transverse colon,                            removed with a cold biopsy forceps. Resected and                            retrieved. Recommendation:           - Patient has a contact number available for                            emergencies.  The signs and symptoms of potential                            delayed complications were discussed with the                            patient. Return to normal activities tomorrow.                            Written discharge instructions were provided to the                            patient.                           - Resume previous diet.                           - Continue present medications.                           - Await pathology results.                           - Repeat colonoscopy is recommended to check                            healing. The colonoscopy date will be determined                            after pathology results from today's exam become                            available for review.                           - Return to GI clinic in 2 weeks.                           - Use Apriso 0.375 g 4 caps PO QAM.                           - Use Rowasa enemas 1 per rectum daily for 2 weeks. Mauri Pole, MD 10/25/2016 10:44:08 AM This report has been signed electronically.

## 2016-10-25 NOTE — Progress Notes (Signed)
Report to PACU, RN, vss, BBS= Clear.  

## 2016-10-26 ENCOUNTER — Telehealth: Payer: Self-pay | Admitting: *Deleted

## 2016-10-26 NOTE — Telephone Encounter (Signed)
  Follow up Call-  Call back number 10/25/2016  Post procedure Call Back phone  # [phone] cell  Permission to leave phone message Yes  Some recent data might be hidden     Patient questions:  Do you have a fever, pain , or abdominal swelling? No. Pain Score  0 *  Have you tolerated food without any problems? Yes.    Have you been able to return to your normal activities? Yes.    Do you have any questions about your discharge instructions: Diet   No. Medications  No. Follow up visit  No.  Do you have questions or concerns about your Care? No.  Actions: * If pain score is 4 or above: No action needed, pain <4.

## 2016-11-11 NOTE — Progress Notes (Signed)
Letter sent via My Chart.

## 2016-11-19 ENCOUNTER — Encounter: Payer: Self-pay | Admitting: *Deleted

## 2016-11-19 ENCOUNTER — Ambulatory Visit (INDEPENDENT_AMBULATORY_CARE_PROVIDER_SITE_OTHER): Payer: BLUE CROSS/BLUE SHIELD | Admitting: Gastroenterology

## 2016-11-19 ENCOUNTER — Encounter: Payer: Self-pay | Admitting: Gastroenterology

## 2016-11-19 ENCOUNTER — Other Ambulatory Visit (INDEPENDENT_AMBULATORY_CARE_PROVIDER_SITE_OTHER): Payer: BLUE CROSS/BLUE SHIELD

## 2016-11-19 VITALS — BP 120/70 | HR 82 | Ht 68.0 in | Wt 191.0 lb

## 2016-11-19 DIAGNOSIS — K5190 Ulcerative colitis, unspecified, without complications: Secondary | ICD-10-CM | POA: Diagnosis not present

## 2016-11-19 DIAGNOSIS — K5100 Ulcerative (chronic) pancolitis without complications: Secondary | ICD-10-CM | POA: Diagnosis not present

## 2016-11-19 LAB — C-REACTIVE PROTEIN: CRP: 0.4 mg/dL — AB (ref 0.5–20.0)

## 2016-11-19 MED ORDER — MESALAMINE 1.2 G PO TBEC: 4.8000 g | DELAYED_RELEASE_TABLET | Freq: Every day | ORAL | 2 refills | Status: AC

## 2016-11-19 NOTE — Progress Notes (Signed)
11/19/2016 Brian Bates [id number] 03-27-1971   HISTORY OF PRESENT ILLNESS:  This is a 46 year old male who is here for follow-up of his ulcerative colitis. He was last seen here by myself on April 17 at which time I placed him on use there is 9 mg daily and initiated evaluation for biologic therapy. He is also scheduled for colonoscopy and had that performed on May 7 by Dr. Silverio Decamp.  This confirmed severe left-sided ulcerative colitis.  He remained on Uceris 9 mg daily and was placed on Apriso 4 pills daily.  He thinks that the Apriso is causing him lower extremity edema, however.  Other than that he is doing extremely well.  He says that he feels that he is 90+% better than when he was seen here in April. He says that he feels the best that he has in over 10 years. He has been able to work some. He is still having occasional episodes of urgency and incontinence, but not nearly as severe as it was previously. Has still been seeing some blood until about 5 days ago. Denies abdominal pain.   Past Medical History:  Diagnosis Date  . Allergic rhinitis    otc antihistamine  . Allergy   . Anxiety   . Bipolar 2 disorder [facility])    diagnosed by psychiatry Dr. Reece Levy- 6 years ago  . Chicken pox   . GERD (gastroesophageal reflux disease)    prn  . SOB (shortness of breath) on exertion    with stress  . Ulcerative colitis [facility])    Past Surgical History:  Procedure Laterality Date  . COLONOSCOPY     x2  . COLONOSCOPY WITH PROPOFOL N/A 05/29/2015   Procedure: COLONOSCOPY WITH PROPOFOL;  Surgeon: [name], MD;  Location: [facility];  Service: Endoscopy;  Laterality: N/A;  . TONSILECTOMY/ADENOIDECTOMY WITH MYRINGOTOMY    . WISDOM TOOTH EXTRACTION      reports that he quit smoking about 22 years ago. His smoking use included Cigarettes. He has a 1.00 pack-year smoking history. He has never used smokeless tobacco. He reports that he does not drink alcohol or use drugs. family history  includes Alcohol abuse in his maternal grandfather; Asthma in his brother; Bipolar disorder in his mother and sister; Breast cancer in his maternal grandmother; Cataracts in his father; Esophagitis in his mother; Heart attack in his mother; Hypertension in his mother; Lupus in his mother; Other in his sister; Sjogren's syndrome in his mother; Stroke in his mother. No Known Allergies    Outpatient Encounter Prescriptions as of 11/19/2016  Medication Sig  . Budesonide (UCERIS) 9 MG TB24 Take 9 mg by mouth daily.  . diphenoxylate-atropine (LOMOTIL) 2.5-0.025 MG tablet Take 1 tablet by mouth 4 (four) times daily as needed for diarrhea or loose stools.  . mesalamine (APRISO) 0.375 g 24 hr capsule Take 4 capsules by mouth daily.  . Mesalamine (DELZICOL) 400 MG CPDR DR capsule Take 1,600 mg by mouth daily.  . NON FORMULARY Broncaid 1 pill daily prn  . [DISCONTINUED] DELZICOL 400 MG CPDR DR capsule TAKE 4 CAPSULES (1,600 MG TOTAL) BY MOUTH 4 (FOUR) TIMES DAILY. (Patient not taking: Reported on 10/25/2016)  . [DISCONTINUED] mesalamine (ROWASA) 4 g enema Place 60 mLs (4 g total) rectally 1 day or 1 dose.  . [DISCONTINUED] predniSONE (DELTASONE) 10 MG tablet TAKE 1 TABLET BY MOUTH 2 TIMES DAILY WITH A MEAL. (Patient not taking: Reported on 10/25/2016)   Facility-Administered Encounter Medications as of  11/19/2016  Medication  . 0.9 %  sodium chloride infusion     REVIEW OF SYSTEMS  : All other systems reviewed and negative except where noted in the History of Present Illness.   PHYSICAL EXAM: BP 120/70   Pulse 82   Ht 5\' 8"  (1.727 m)   Wt 191 lb (86.6 kg)   BMI 29.04 kg/m  General: Well developed white male in no acute distress Head: Normocephalic and atraumatic Eyes:  Sclerae anicteric, conjunctiva pink. Ears: Normal auditory acuity Lungs: Clear throughout to auscultation Heart: Regular rate and rhythm Abdomen: Soft, non-distended. Normal bowel sounds.  Non-tender. Musculoskeletal: Symmetrical  with no gross deformities  Skin: No lesions on visible extremities Extremities: No edema  Neurological: Alert oriented x 4, grossly non-focal Psychological:  Alert and cooperative. Normal mood and affect  ASSESSMENT AND PLAN: *46 year old male with severe UC as confirmed on recent colonoscopy:  Is much improved clinically on Uceris 9 mg daily and mesalamine.  The plan is to try and start him on Remicade so we will get that initiated. He will continue Uceris 9 mg daily for now. We would like him to continue mesalamine as well, but he thinks that he is having swelling with the Apriso and did not tolerate the Delzicol well previously. We will try to get Lialda 1.2 g, 4 tablets daily for total of 4.8 g instead. We'll check a CRP level today.  CC:  [name], MD

## 2016-11-19 NOTE — Telephone Encounter (Signed)
Dr. Silverio Decamp,  Do you wish for this patient to have a recall entered?  Sonia Side

## 2016-11-19 NOTE — Telephone Encounter (Signed)
Erroneous encounter

## 2016-11-19 NOTE — Progress Notes (Signed)
Reviewed and agree with documentation and assessment and plan. K. Veena Lillien Petronio , MD   

## 2016-11-19 NOTE — Telephone Encounter (Signed)
Recall for surveillance with h/o UC in 1 year. Thanks

## 2016-11-19 NOTE — Patient Instructions (Addendum)
We have sent the following medications to your pharmacy for you to pick up at your convenience: Lialda 1.2 grams 4 tablets daily.   Stop Apriso and Delzicol.  Continue Uceris 9 mg daily.

## 2017-01-15 ENCOUNTER — Other Ambulatory Visit: Payer: Self-pay | Admitting: Gastroenterology

## 2017-10-23 ENCOUNTER — Encounter: Payer: Self-pay | Admitting: Gastroenterology

## 2018-06-03 IMAGING — CT CT ABD-PELV W/ CM
2 of 5 series · 15 of 46 positions shown, 17 images · IV contrast (ISOVUE)
Comparison: None available.

CLINICAL DATA: Initial evaluation for acute diarrhea, abdominal
pain, bloody stools. History of ulcerative colitis.

EXAM:
CT ABDOMEN AND PELVIS WITH CONTRAST
TECHNIQUE: Multidetector CT imaging of the abdomen and pelvis was performed
using the standard protocol following bolus administration of
intravenous contrast.
CONTRAST:  100mL [id number] IOPAMIDOL ([id number]) INJECTION 61%

[Series 2: abd/pel with · axial · 0.81mm/px · z∈[-506,-116]mm · 12 of 94 slices shown, 14 images]
[im 8/94  soft-tissue]
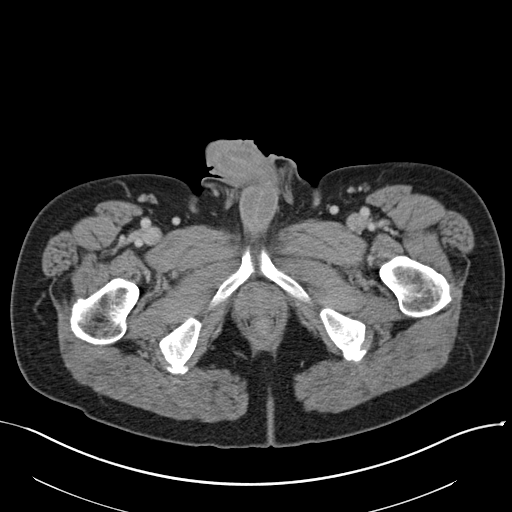
[im 8/94  bone]
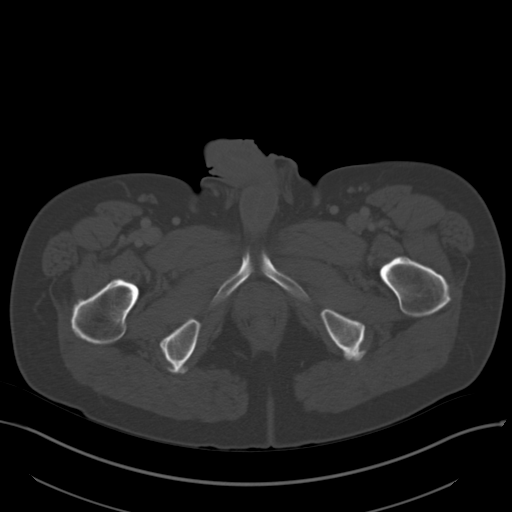
[im 15/94  soft-tissue]
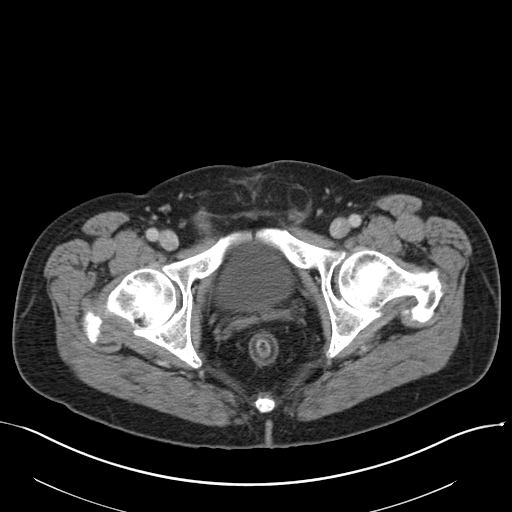
[im 22/94  soft-tissue]
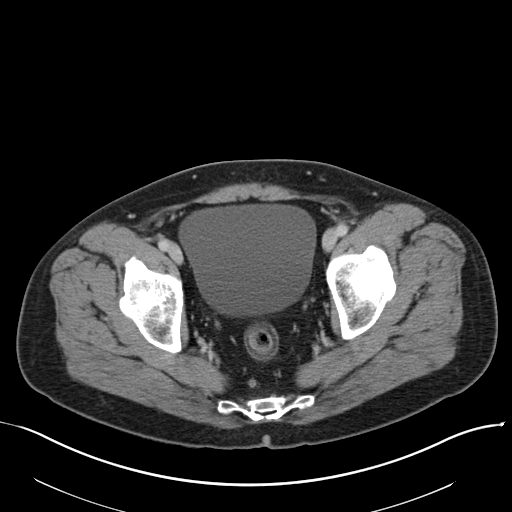
[im 29/94  soft-tissue]
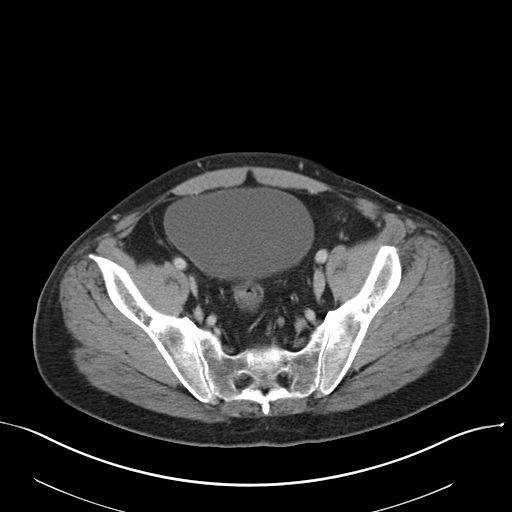
[im 36/94  soft-tissue]
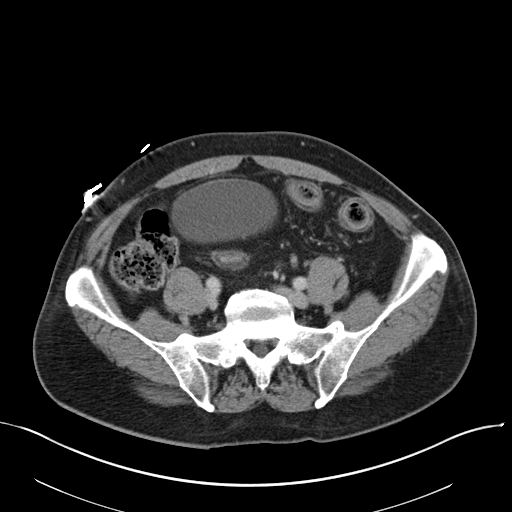
[im 43/94  soft-tissue]
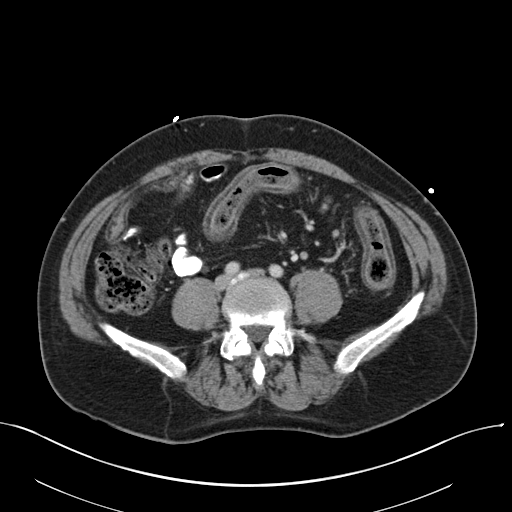
[im 51/94  soft-tissue]
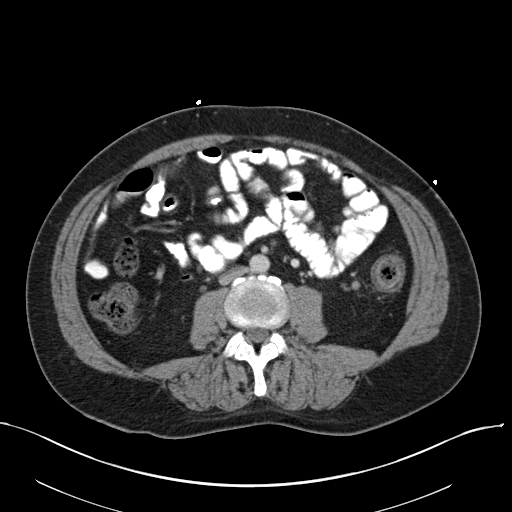
[im 58/94  soft-tissue]
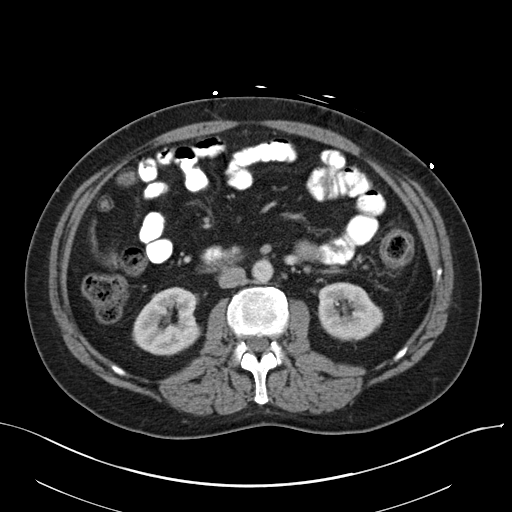
[im 65/94  soft-tissue]
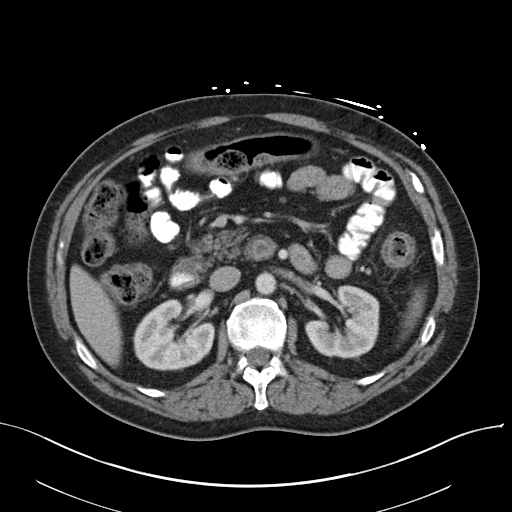
[im 65/94  bone]
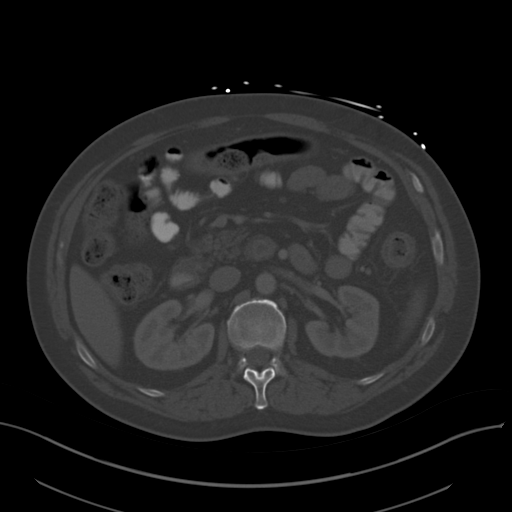
[im 72/94  soft-tissue]
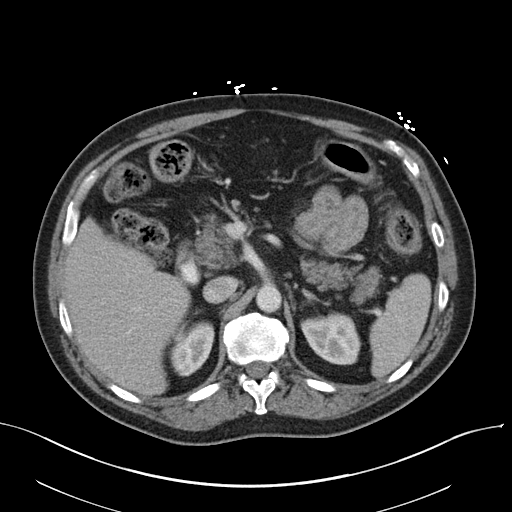
[im 79/94  soft-tissue]
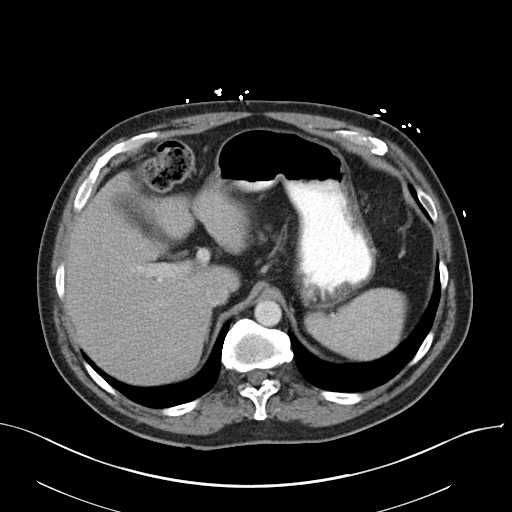
[im 86/94  soft-tissue]
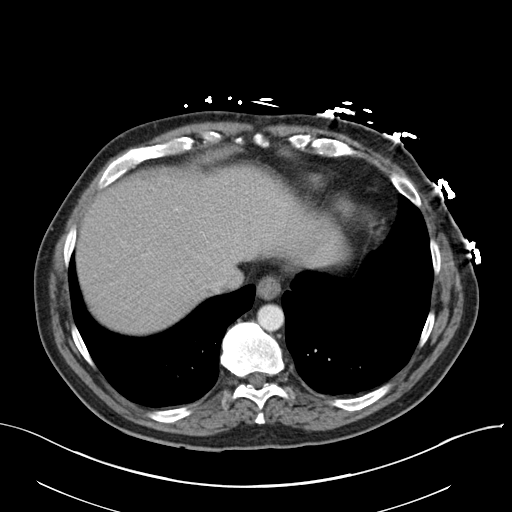

[Series 5: coronal a/|p · coronal · 0.74mm/px · 3 of 137 slices shown]
[im 46/137  soft-tissue]
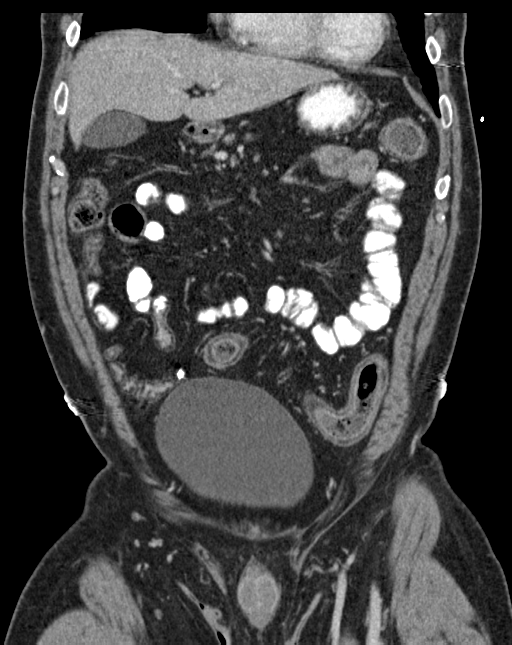
[im 61/137  soft-tissue]
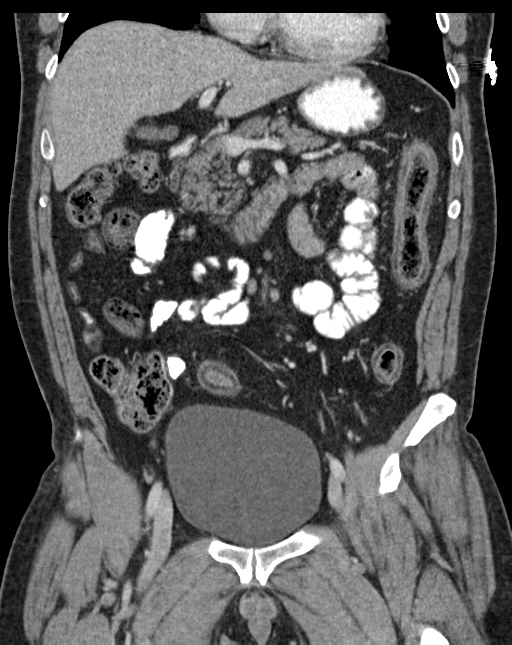
[im 76/137  soft-tissue]
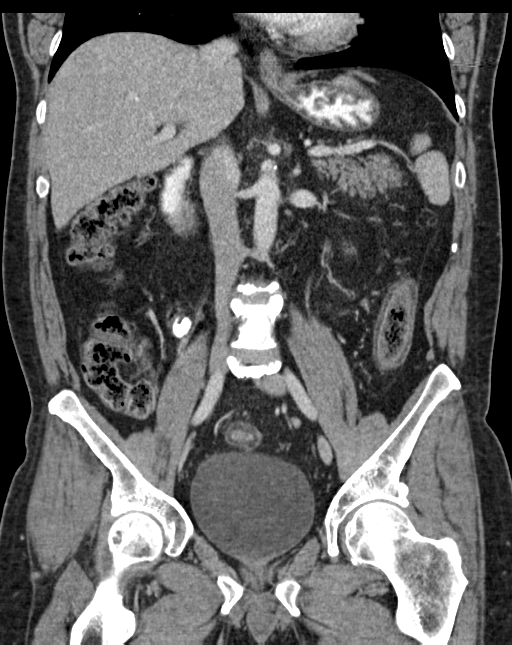

[15 of 46 positions shown; findings below may reference images not displayed]

FINDINGS: Lower chest: Visualized lung bases are clear.  The

Hepatobiliary: Liver demonstrates a normal contrast enhanced
appearance. Gallbladder normal. No biliary dilatation.

Pancreas: Pancreas within normal limits.

Spleen: Spleen within normal limits.

Adrenals/Urinary Tract: Adrenal glands are normal. Kidneys equal
size with symmetric enhancement. No nephrolithiasis, hydronephrosis,
or focal enhancing renal mass. No hydroureter. Bladder within normal
limits.

Stomach/Bowel: Stomach normal. No evidence for bowel obstruction.
There is abnormal diffuse circumferential wall thickening involving
much of the colon, essentially extending from the mid transverse
colon through the rectum. The involved colon is somewhat featureless
with mild hazy perienteric stranding. Findings consistent with acute
colitis, likely related to UC flare. No complication. No other acute
inflammatory changes seen about the bowels. Mild wall thickening
about the ileum like early related incomplete distension.

Vascular/Lymphatic: Mild atheromatous plaque within the
intra-abdominal aorta. No aneurysm. No adenopathy.

Reproductive: Prostate normal.

Other: Small fat containing left inguinal hernia noted. No free
intraperitoneal air. No free fluid.

Musculoskeletal: No acute osseous abnormality. No worrisome lytic or
blastic osseous lesions.
IMPRESSION: 1. Findings consistent with acute colitis, with long segment area of
continuous involvement from the mid transverse colon through the
rectum. Findings likely related to acute ulcer though colitis flare.
No complication.
2. No other acute intra-abdominal or pelvic process.
